# Patient Record
Sex: Female | Born: 1970 | Hispanic: No | Marital: Married | Smoking: Never smoker
Health system: Southern US, Community
[De-identification: ages and names within clinical notes are randomized; demographics above are authoritative.]

## PROBLEM LIST (undated history)

## (undated) DIAGNOSIS — J45909 Unspecified asthma, uncomplicated: Secondary | ICD-10-CM

## (undated) DIAGNOSIS — M654 Radial styloid tenosynovitis [de Quervain]: Secondary | ICD-10-CM

## (undated) DIAGNOSIS — I10 Essential (primary) hypertension: Secondary | ICD-10-CM

## (undated) DIAGNOSIS — R0789 Other chest pain: Secondary | ICD-10-CM

## (undated) DIAGNOSIS — M940 Chondrocostal junction syndrome [Tietze]: Secondary | ICD-10-CM

## (undated) HISTORY — DX: Other chest pain: R07.89

## (undated) HISTORY — PX: BREAST BIOPSY: SHX20

## (undated) HISTORY — DX: Chondrocostal junction syndrome (tietze): M94.0

## (undated) HISTORY — DX: Radial styloid tenosynovitis (de quervain): M65.4

## (undated) HISTORY — PX: APPENDECTOMY: SHX54

## (undated) HISTORY — PX: TOTAL LAPAROSCOPIC HYSTERECTOMY WITH SALPINGECTOMY: SHX6742

## (undated) HISTORY — PX: CHOLECYSTECTOMY: SHX55

## (undated) HISTORY — PX: ECTOPIC PREGNANCY SURGERY: SHX613

## (undated) HISTORY — PX: ABDOMINAL HYSTERECTOMY: SHX81

---

## 2004-12-21 ENCOUNTER — Emergency Department: Payer: Self-pay | Admitting: Emergency Medicine

## 2004-12-21 ENCOUNTER — Other Ambulatory Visit: Payer: Self-pay

## 2005-03-05 ENCOUNTER — Emergency Department: Payer: Self-pay | Admitting: Unknown Physician Specialty

## 2006-01-10 ENCOUNTER — Emergency Department: Payer: Self-pay | Admitting: Emergency Medicine

## 2006-04-06 ENCOUNTER — Emergency Department: Payer: Self-pay | Admitting: Emergency Medicine

## 2006-06-10 ENCOUNTER — Ambulatory Visit: Payer: Self-pay | Admitting: *Deleted

## 2006-09-12 ENCOUNTER — Ambulatory Visit: Payer: Self-pay | Admitting: *Deleted

## 2006-10-31 ENCOUNTER — Other Ambulatory Visit: Payer: Self-pay

## 2006-10-31 ENCOUNTER — Emergency Department: Payer: Self-pay | Admitting: Emergency Medicine

## 2007-07-12 ENCOUNTER — Emergency Department: Payer: Self-pay | Admitting: Emergency Medicine

## 2007-07-15 ENCOUNTER — Ambulatory Visit: Payer: Self-pay | Admitting: Family Medicine

## 2007-11-27 ENCOUNTER — Emergency Department (HOSPITAL_COMMUNITY): Admission: EM | Admit: 2007-11-27 | Discharge: 2007-11-27 | Payer: Self-pay | Admitting: Emergency Medicine

## 2007-12-18 ENCOUNTER — Ambulatory Visit: Payer: Self-pay | Admitting: Cardiovascular Disease

## 2008-01-06 ENCOUNTER — Encounter: Payer: Self-pay | Admitting: Cardiovascular Disease

## 2008-01-06 ENCOUNTER — Ambulatory Visit: Payer: Self-pay

## 2008-05-23 ENCOUNTER — Ambulatory Visit: Payer: Self-pay | Admitting: Family Medicine

## 2008-06-14 DIAGNOSIS — K8020 Calculus of gallbladder without cholecystitis without obstruction: Secondary | ICD-10-CM | POA: Insufficient documentation

## 2009-08-02 ENCOUNTER — Ambulatory Visit: Payer: Self-pay

## 2009-08-09 ENCOUNTER — Ambulatory Visit: Payer: Self-pay

## 2009-08-23 ENCOUNTER — Ambulatory Visit: Payer: Self-pay

## 2009-08-24 ENCOUNTER — Ambulatory Visit: Payer: Self-pay

## 2009-08-28 LAB — PATHOLOGY REPORT

## 2009-11-03 ENCOUNTER — Emergency Department: Payer: Self-pay | Admitting: Emergency Medicine

## 2010-05-29 NOTE — Assessment & Plan Note (Signed)
[facility]                            CARDIOLOGY OFFICE NOTE   NAME:ENOCHLayton, Tappan                         MRN:          [id number]  DATE:12/18/2007                            DOB:          1970-09-19    Ms. Linders is seen today at the request of [facility]  and [facility].   The patient was seen recently in the ER for chest pain.  She ruled out  for myocardial infarction, had a low-risk chest x-ray and EKG.  The  patient has been having symptoms for the last couple of months.  She was  initially diagnosed with costochondritis.  She has had this in the past.  She states nearly 90s.  Her pain is very atypical.  It is sharp.  It is  in the center of her chest.  It is related to increased problems with  breathing and asthma.  She has had a recent diagnosis over the last 2  years.  She is a nonsmoker.  However, during her ER visit, she had to  have inhalers.  She subsequently had improvement in her chest pain  syndrome with a Prevpak.   The pain in her chest was clearly related to a bronchitis and asthmatic  event as well.   She seems to think that her symptoms are persistent, but they are  improved.   There is no obvious pleuritic component.  She has not had previous DVT.   There is no previous history of connective tissue disease.   The patient's review of systems are remarkable for significant headache  and photophobia.   Her past medical history is remarkable for hypertension.  There is also  a history of syphilis, apparently this was in her younger days.  It was  suboptimally treated and her titers have risen recently.  I do not know  any other details about this.  She did have an HIV test in August, which  was negative.  She has a history of headaches.   Also, history of appendectomy.  Two previous appendectomies.   ALLERGIES:  The patient is allergic to ERYTHROMYCIN and GUANFACINE.   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  She is married.  She has 1 child, who will be 65  years old soon.  She enjoys playing on the computer and is fairly  sedentary.  The patient does not smoke.  She does not drink on a regular  basis.   FAMILY HISTORY:  Remarkable for mother being alive at age 41.  Father  died of unknown causes at age 41.   CURRENT MEDICATIONS:  1. Flovent 2 puffs b.i.d.  2. Albuterol p.r.n.  3. Hydrochlorothiazide.  4. Lisinopril, dose unknown.   PHYSICAL EXAMINATION:  GENERAL:  Remarkable for an overweight  photophobic black female.  There is hint of a slight malar rash.  VITAL SIGNS:  Her blood pressure is 130/80; weight is 266; pulse 88 and  regular; and respiratory rate 14, afebrile.  HEENT:  Unremarkable.  NECK:  No cervical adenopathy.  No lymphadenopathy, thyromegaly, or JVP  elevation.  LUNGS:  Clear, good diaphragmatic motion.  No wheezing.  S1 and S2.  Normal heart sounds.  PMI normal.  ABDOMEN:  Benign.  Bowel sounds positive.  No AAA.  No tenderness.  No  bruit.  No hepatosplenomegaly.  No hepatojugular reflux.  EXTREMITIES:  Distal pulses are intact.  No edema.  NEURO:  Nonfocal.  SKIN:  Warm and dry.  MUSCULOSKELETAL:  No muscular weakness.   Her baseline EKG is normal.   IMPRESSION:  1. 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  Continue nonsteroidal      antiinflammatories.  At this point, I do not think a stress test is      indicated.  However, we will do a 2-D echocardiogram to assess      structural integrity of her heart and rule out pericarditis.  2. In terms of her chest pain syndrome, she does have a slight malar      rash.  I think we should try some basic lab work to rule out any      inflammatory connective tissue disease.  This will include a sed      rate, a rheumatoid factor, and ANA.  3. Hypertension, currently well controlled.  Continue current dose of      lisinopril/hydrochlorothiazide.  4.  Asthma, currently appears to be well controlled with improvement on      prednisone.  Continue current dose of inhalers.  5. Severe headache in the setting of rising syphilis titers.  We will      check a head CT on the patient today.  Since she has a bad headache      and photophobia, I think this would be important to rule out any      central nervous system lesions.   Further recommendations based on the results of her head CT, lab work,  and echo.     Noralyn Pick. Eden Emms, MD, [facility]  Electronically Signed    PCN/MedQ  DD: 12/18/2007  DT: 12/19/2007  Job #: [id number]

## 2010-07-19 ENCOUNTER — Emergency Department: Payer: Self-pay | Admitting: Emergency Medicine

## 2010-07-23 ENCOUNTER — Emergency Department: Payer: Self-pay | Admitting: Emergency Medicine

## 2010-09-12 IMAGING — CR DG CHEST 1V PORT
1 series · 1 of 1 positions shown · non-contrast
Comparison: None

CLINICAL DATA: Chest pain.

PORTABLE CHEST - 1 VIEW

[AP]
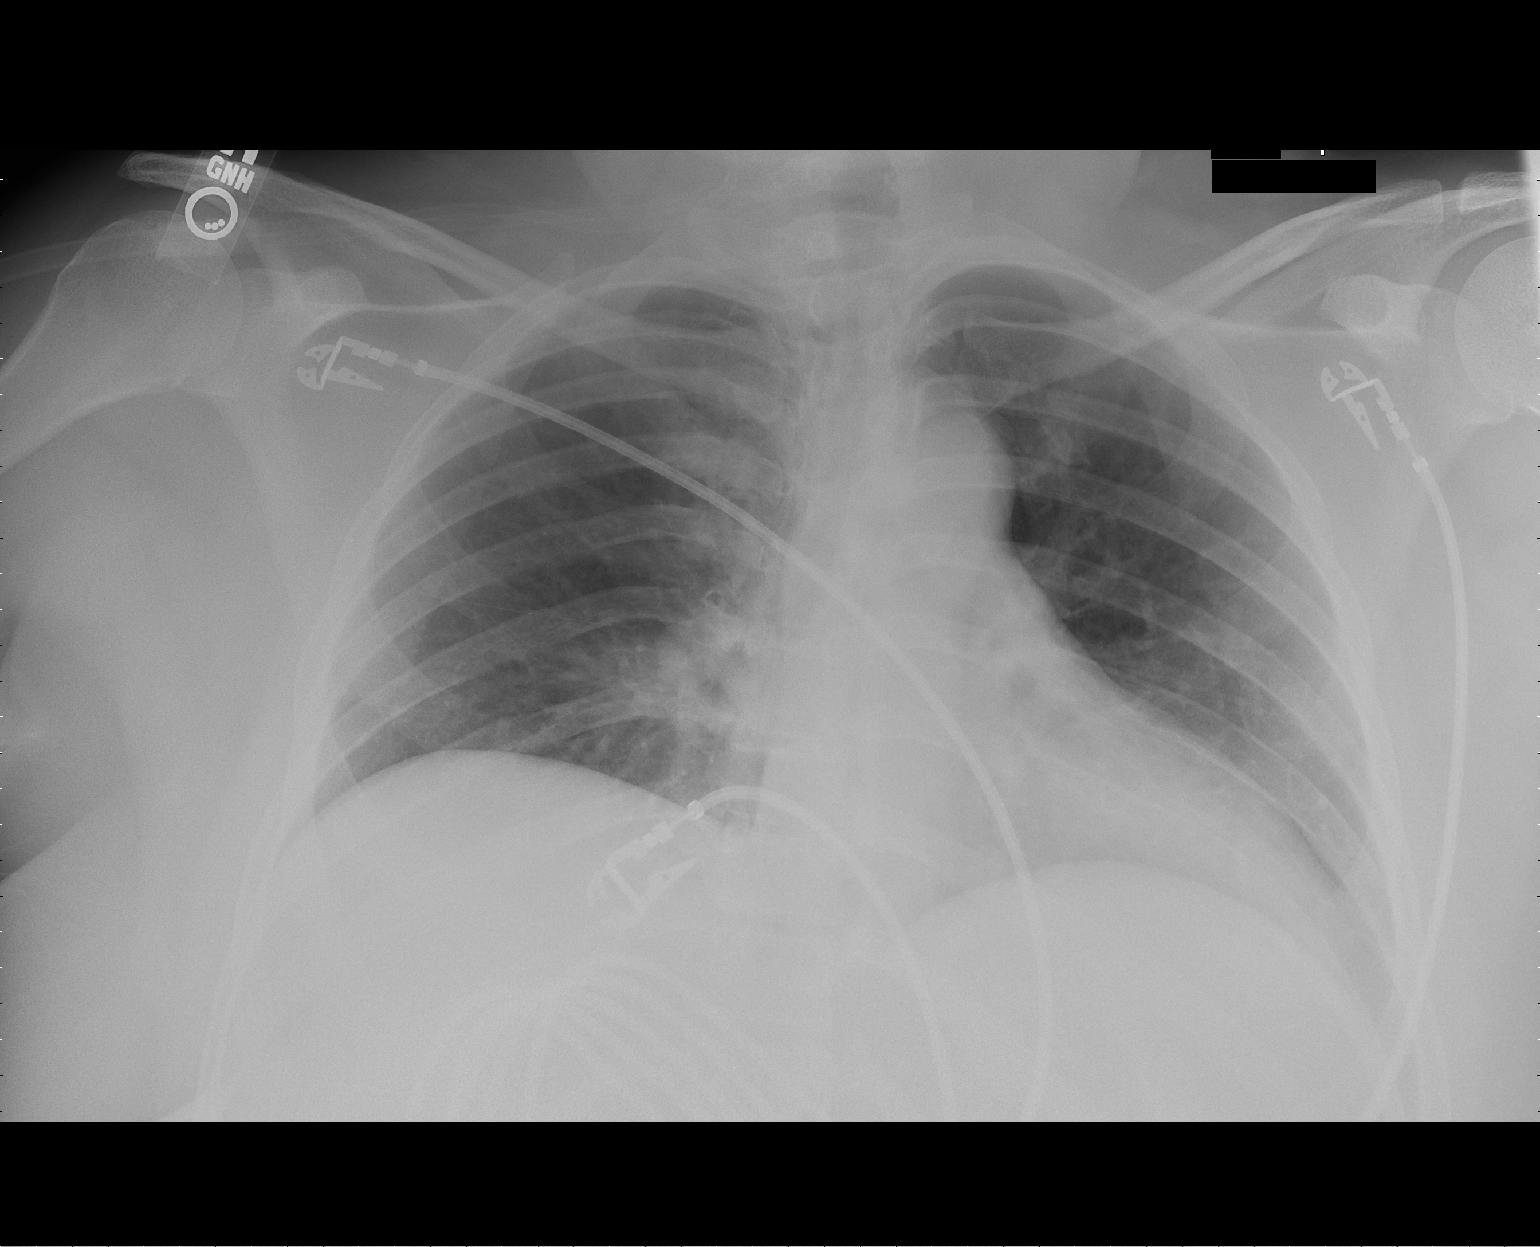

[1 of 1 positions shown; findings below may reference images not displayed]

FINDINGS: Decreased lung volume with mild atelectasis in the lung
bases.  There is no heart failure or effusion.  The heart is mildly
enlarged.
IMPRESSION: Hypoventilation with bibasilar atelectasis.

## 2010-10-16 LAB — POCT CARDIAC MARKERS
CKMB, poc: <1 — ABNORMAL LOW
CKMB, poc: <1 — ABNORMAL LOW
CKMB, poc: <1 — ABNORMAL LOW
Myoglobin, poc: 72.8
Troponin i, poc: <0.05
Troponin i, poc: <0.05

## 2010-10-16 LAB — POCT I-STAT, CHEM 8
Calcium, Ion: 1.14
Creatinine, Ser: 1.2
Potassium: 3.6
Sodium: 141

## 2010-10-16 LAB — D-DIMER, QUANTITATIVE: D-Dimer, Quant: 0.25

## 2010-10-26 ENCOUNTER — Other Ambulatory Visit (HOSPITAL_COMMUNITY): Payer: Self-pay | Admitting: Obstetrics and Gynecology

## 2010-10-26 DIAGNOSIS — N979 Female infertility, unspecified: Secondary | ICD-10-CM

## 2010-11-01 ENCOUNTER — Ambulatory Visit (HOSPITAL_COMMUNITY)
Admission: RE | Admit: 2010-11-01 | Discharge: 2010-11-01 | Disposition: A | Discharge status: Home / Self Care | Payer: BC Managed Care – PPO | Source: Ambulatory Visit | Attending: Obstetrics and Gynecology | Admitting: Obstetrics and Gynecology

## 2010-11-01 DIAGNOSIS — N979 Female infertility, unspecified: Secondary | ICD-10-CM | POA: Insufficient documentation

## 2010-11-01 MED ORDER — IOHEXOL 300 MG/ML  SOLN: 12.0000 mL | Freq: Once | INTRAMUSCULAR | Status: AC | PRN

## 2011-06-05 ENCOUNTER — Encounter (HOSPITAL_COMMUNITY): Payer: Self-pay | Admitting: *Deleted

## 2011-06-05 ENCOUNTER — Emergency Department (HOSPITAL_COMMUNITY): Payer: BC Managed Care – PPO

## 2011-06-05 ENCOUNTER — Emergency Department (HOSPITAL_COMMUNITY)
Admission: EM | Admit: 2011-06-05 | Discharge: 2011-06-05 | Disposition: A | Discharge status: Home / Self Care | Payer: BC Managed Care – PPO | Attending: Emergency Medicine | Admitting: Emergency Medicine

## 2011-06-05 DIAGNOSIS — Z79899 Other long term (current) drug therapy: Secondary | ICD-10-CM | POA: Insufficient documentation

## 2011-06-05 DIAGNOSIS — G43909 Migraine, unspecified, not intractable, without status migrainosus: Secondary | ICD-10-CM | POA: Insufficient documentation

## 2011-06-05 MED ORDER — DEXAMETHASONE SODIUM PHOSPHATE 10 MG/ML IJ SOLN
10.0000 mg | Freq: Once | INTRAMUSCULAR | Status: AC
  Administered 2011-06-05: 10 mg via INTRAVENOUS
  Filled 2011-06-05: qty 1

## 2011-06-05 MED ORDER — METOCLOPRAMIDE HCL 5 MG/ML IJ SOLN
10.0000 mg | Freq: Once | INTRAMUSCULAR | Status: AC
  Administered 2011-06-05: 10 mg via INTRAVENOUS
  Filled 2011-06-05: qty 2

## 2011-06-05 MED ORDER — DIPHENHYDRAMINE HCL 50 MG/ML IJ SOLN
25.0000 mg | Freq: Once | INTRAMUSCULAR | Status: AC
  Administered 2011-06-05: 25 mg via INTRAVENOUS
  Filled 2011-06-05: qty 1

## 2011-06-05 MED ORDER — ONDANSETRON HCL 4 MG PO TABS: 4.0000 mg | ORAL_TABLET | Freq: Four times a day (QID) | ORAL | Status: AC

## 2011-06-05 NOTE — ED Notes (Signed)
Patient back from CT.

## 2011-06-05 NOTE — ED Provider Notes (Signed)
History     CSN: [id number]  Arrival date & time 06/05/11  1412   None     Chief Complaint  Patient presents with  . Migraine  . Tingling    (Consider location/radiation/quality/duration/timing/severity/associated sxs/prior treatment) Patient is a 41 y.o. female presenting with migraine. The history is provided by the patient. No language interpreter was used.  Migraine This is a new problem. The current episode started in the past 7 days. The problem occurs constantly. The problem has been gradually worsening. Associated symptoms include headaches and nausea. Pertinent negatives include no chills, fever, neck pain, numbness, vertigo, vomiting or weakness. Exacerbated by: light. She has tried NSAIDs for the symptoms.  [age]-year-old female with complaint of migraine headache that started gradually on Monday night. States that she woke Tuesday with a severe headache and has taking 1400 mg of ibuprofen yesterday. Today she went to work at the [employer] office and they gave her 800 mg of ibuprofen with no relief. Has  nausea but no vomiting photophobia and facial tingling.  The headache started out primarily bilaterally it felt like a vice to her for head. Now the pain is primarily on the right on the right eye. Neuro intact. Last vision exam last month no changes. A history of migraines. Last migraine headache was in 2003. CT is Dr. Ranae Pila in [facility] Clinical history of hypertension migraine costochondritis the patient is on metformin for fertility issues.  Past Medical History  Diagnosis Date  . Migraine     Past Surgical History  Procedure Date  . Cholecystectomy   . Appendectomy   . Ectopic pregnancy surgery     No family history on file.  History  Substance Use Topics  . Smoking status: Never Smoker   . Smokeless tobacco: Not on file  . Alcohol Use: Yes    OB History    Grav Para Term Preterm Abortions TAB SAB Ect Mult Living                  Review of  Systems  Constitutional: Negative.  Negative for fever and chills.  HENT: Positive for sneezing. Negative for hearing loss, ear pain, neck pain, dental problem, sinus pressure and ear discharge.   Eyes: Negative.   Respiratory: Negative.   Cardiovascular: Negative.   Gastrointestinal: Positive for nausea. Negative for vomiting.  Neurological: Positive for headaches. Negative for dizziness, vertigo, facial asymmetry, speech difficulty, weakness and numbness.  Psychiatric/Behavioral: Negative.   All other systems reviewed and are negative.    Allergies  Erythromycin and Guaifenesin & derivatives  Home Medications   Current Outpatient Rx  Name Route Sig Dispense Refill  . ALBUTEROL SULFATE HFA 108 (90 BASE) MCG/ACT IN AERS Inhalation Inhale 2 puffs into the lungs every 6 (six) hours as needed. For SOB    . AMLODIPINE BESYLATE 10 MG PO TABS Oral Take 10 mg by mouth daily.    Marland Kitchen CITALOPRAM HYDROBROMIDE 10 MG PO TABS Oral Take 10 mg by mouth at bedtime.    Marland Kitchen FLUTICASONE PROPIONATE  HFA 220 MCG/ACT IN AERO Inhalation Inhale 2 puffs into the lungs 2 (two) times daily.    Marland Kitchen HYDROCHLOROTHIAZIDE 25 MG PO TABS Oral Take 25 mg by mouth daily.    Marland Kitchen LABETALOL HCL 100 MG PO TABS Oral Take 100 mg by mouth 2 (two) times daily.    Marland Kitchen METFORMIN HCL ER 500 MG PO TB24 Oral Take 500 mg by mouth daily with breakfast.  BP 144/85  Pulse 91  Temp(Src) 98.2 F (36.8 C) (Oral)  Resp 18  Ht 5' 7.5" (1.715 m)  Wt 250 lb 2 oz (113.456 kg)  BMI 38.60 kg/m2  SpO2 98%  Physical Exam  Nursing note and vitals reviewed. Constitutional: She is oriented to person, place, and time. She appears well-developed and well-nourished.  HENT:  Head: Normocephalic and atraumatic.  Eyes: Conjunctivae and EOM are normal. Pupils are equal, round, and reactive to light.  Neck: Normal range of motion. Neck supple.  Cardiovascular: Normal rate.   Pulmonary/Chest: Effort normal and breath sounds normal. No respiratory  distress.  Abdominal: Soft. Bowel sounds are normal. She exhibits no distension. There is no tenderness.  Musculoskeletal: Normal range of motion. She exhibits no edema and no tenderness.  Neurological: She is alert and oriented to person, place, and time. She has normal strength and normal reflexes. No cranial nerve deficit or sensory deficit. She displays a negative Romberg sign. GCS eye subscore is 4. GCS verbal subscore is 5. GCS motor subscore is 6.  Skin: Skin is warm and dry.  Psychiatric: She has a normal mood and affect.    ED Course  Procedures (including critical care time)  Labs Reviewed - No data to display No results found.   No diagnosis found.    MDM  Migraine h/a with relief in the ER after migraine cocktail.  Neuro in tact.  Will follow up with pcp in  this week.  Return if worse.          Remi Haggard, NP 06/06/11 1242

## 2011-06-05 NOTE — ED Notes (Addendum)
States pain started on Tuesday and has gotten worsen.  C/O light irritation today.  C/O nausea only, denies vomiting.

## 2011-06-05 NOTE — ED Notes (Signed)
Patient transported to CT 

## 2011-06-05 NOTE — Discharge Instructions (Signed)
Ms Boyan we gave you a migraine cocktail in the ER today with Benadryl, decadron and reglan.  Followup with your PCP so you can come up with a plan to treat the migraine headaches if they reoccur. Zofran is for nausea if you have more nausea when you go home. Return to the ER for severe pain, weakness on one side the other, or uncontrolled nausea and vomiting.  Recurrent Migraine Headache You have a recurrent migraine headache. The caregiver can usually provide good relief for this headache. If this headache is the same as your previous migraine headaches, it is safe to treat you without repeating a complete evaluation.  These headaches usually have at least two of the following problems:   They occur on one side of the head, pulsate, and are severe enough to prevent daily activities.   They are aggravated by daily physical activities.  You may have one or more of the following symptoms:   Nausea (feeling sick to your stomach).   Vomiting.   Pain with exposure to bright lights or loud noises.  Most headache sufferers have a family history of migraines. Your headaches may also be related to alcohol and smoking habits. Too much sleep, too little sleep, mood, and anxiety may also play a part. Changing some of these triggers may help you lower the number and level of pain of the headaches. Headaches may be related to menses (female menstruation). There are numerous medications that can prevent these headaches. Your caregiver can help you with a medication or regimen (procedure to follow). If this has been a chronic (long-term) condition, the use of long-term narcotics is not recommended. Using long-term narcotics can cause recurrent migraines. Narcotics are only a temporary measure only. They are used for the infrequent migraine that fails to respond to all other measures. SEEK MEDICAL CARE IF:   You do not get relief from the medications given to you.   You have a recurrence of pain.   This  headache begins to differ from past migraine (for example if it is more severe).  SEEK IMMEDIATE MEDICAL CARE IF:  You have a fever.   You have a stiff neck.   You have vision loss or have changes in vision.   You have problems with feeling lightheaded, become faint, or lose your balance.   You have muscular weakness.   You have loss of muscular control.   You develop severe symptoms different from your first symptoms.   You start losing your balance or have trouble walking.   You feel faint or pass out.  MAKE SURE YOU:   Understand these instructions.   Will watch your condition.   Will get help right away if you are not doing well or get worse.  Document Released: 09/25/2000 Document Revised: 12/20/2010 Document Reviewed: 08/20/2007 [facility] Patient Information 2012 [place], [place].Migraine Headache A migraine is very bad pain on one or both sides of your head. The cause of a migraine is not always known. A migraine can be triggered or caused by different things, such as:  Alcohol.   Smoking.   Stress.   Periods (menstruation) in women.   Aged cheeses.   Foods or drinks that contain nitrates, glutamate, aspartame, or tyramine.   Lack of sleep.   Chocolate.   Caffeine.   Hunger.   Medicines, such as nitroglycerine (used to treat chest pain), birth control pills, estrogen, and some blood pressure medicines.  HOME CARE  Many medicines can help migraine pain or  keep migraines from coming back. Your doctor can help you decide on a medicine or treatment program.   If you or your child gets a migraine, it may help to lie down in a dark, quiet room.   Keep a headache journal. This may help find out what is causing the headaches. For example, write down:   What you eat and drink.   How much sleep you get.   Any change to your diet or medicines.  GET HELP RIGHT AWAY IF:   The medicine does not work.   The pain begins again.   The neck is stiff.   You  have trouble seeing.   The muscles are weak or you lose muscle control.   You have new symptoms.   You lose your balance.   You have trouble walking.   You feel faint or pass out.  MAKE SURE YOU:   Understand these instructions.   Will watch this condition.   Will get help right away if you are not doing well or get worse.  Document Released: 10/10/2007 Document Revised: 12/20/2010 Document Reviewed: 09/05/2008 [facility] Patient Information 2012 [place], [place].

## 2011-06-05 NOTE — ED Notes (Signed)
Patient reports she has had a migraine headache for 1.5 days with tingling all over her face.  This is a new sx for her.  Her md advised that she come to ED for further eval.

## 2011-06-07 NOTE — ED Provider Notes (Signed)
Medical screening examination/treatment/procedure(s) were performed by non-physician practitioner and as supervising physician I was immediately available for consultation/collaboration.   Ilda Laskin, MD 06/07/11 0407 

## 2011-11-27 ENCOUNTER — Ambulatory Visit: Payer: Self-pay | Admitting: Family Medicine

## 2011-12-11 ENCOUNTER — Ambulatory Visit: Payer: Self-pay | Admitting: Family Medicine

## 2012-01-01 ENCOUNTER — Ambulatory Visit: Payer: Self-pay | Admitting: Surgery

## 2012-07-02 ENCOUNTER — Ambulatory Visit: Payer: Self-pay | Admitting: Surgery

## 2012-07-06 ENCOUNTER — Emergency Department: Payer: Self-pay | Admitting: Emergency Medicine

## 2012-07-06 LAB — BASIC METABOLIC PANEL
Anion Gap: 6 — ABNORMAL LOW (ref 7–16)
Chloride: 107 mmol/L (ref 98–107)
Co2: 27 mmol/L (ref 21–32)
Creatinine: 1.06 mg/dL (ref 0.60–1.30)
EGFR (African American): >60
Osmolality: 277 (ref 275–301)
Potassium: 3.7 mmol/L (ref 3.5–5.1)
Sodium: 140 mmol/L (ref 136–145)

## 2012-07-06 LAB — CBC
MCH: 23.9 pg — ABNORMAL LOW (ref 26.0–34.0)
WBC: 5 10*3/uL (ref 3.6–11.0)

## 2012-07-06 LAB — PROTIME-INR
INR: 1.1
Prothrombin Time: 14.1 secs (ref 11.5–14.7)

## 2012-07-06 LAB — TROPONIN I: Troponin-I: <0.02 ng/mL

## 2012-07-24 DIAGNOSIS — D509 Iron deficiency anemia, unspecified: Secondary | ICD-10-CM | POA: Insufficient documentation

## 2012-07-27 ENCOUNTER — Emergency Department: Payer: Self-pay | Admitting: Emergency Medicine

## 2012-07-27 LAB — COMPREHENSIVE METABOLIC PANEL
Albumin: 3.5 g/dL (ref 3.4–5.0)
Alkaline Phosphatase: 176 U/L — ABNORMAL HIGH (ref 50–136)
Anion Gap: 3 — ABNORMAL LOW (ref 7–16)
Chloride: 104 mmol/L (ref 98–107)
Co2: 30 mmol/L (ref 21–32)
Creatinine: 1.07 mg/dL (ref 0.60–1.30)
EGFR (Non-African Amer.): >60
Glucose: 91 mg/dL (ref 65–99)
SGOT(AST): 179 U/L — ABNORMAL HIGH (ref 15–37)
SGPT (ALT): 229 U/L — ABNORMAL HIGH (ref 12–78)
Sodium: 137 mmol/L (ref 136–145)
Total Protein: 7.6 g/dL (ref 6.4–8.2)

## 2012-07-27 LAB — LIPASE, BLOOD: Lipase: 139 U/L (ref 73–393)

## 2012-07-27 LAB — CBC
HCT: 39.5 % (ref 35.0–47.0)
HGB: 12.7 g/dL (ref 12.0–16.0)
MCV: 78 fL — ABNORMAL LOW (ref 80–100)
RBC: 5.07 10*6/uL (ref 3.80–5.20)
RDW: 19.2 % — ABNORMAL HIGH (ref 11.5–14.5)
WBC: 7 10*3/uL (ref 3.6–11.0)

## 2012-07-27 LAB — URINALYSIS, COMPLETE
Bilirubin,UR: NEGATIVE
Glucose,UR: NEGATIVE mg/dL (ref 0–75)
Ph: 6 (ref 4.5–8.0)
RBC,UR: 3 /HPF (ref 0–5)
Squamous Epithelial: 4
WBC UR: 13 /HPF (ref 0–5)

## 2012-07-27 LAB — CLOSTRIDIUM DIFFICILE BY PCR

## 2012-07-30 LAB — URINE CULTURE

## 2012-08-26 LAB — STOOL CULTURE

## 2012-12-23 ENCOUNTER — Ambulatory Visit: Payer: Self-pay | Admitting: Surgery

## 2013-03-04 ENCOUNTER — Emergency Department: Payer: Self-pay | Admitting: Emergency Medicine

## 2013-03-04 LAB — URINALYSIS, COMPLETE
BILIRUBIN, UR: NEGATIVE
GLUCOSE, UR: NEGATIVE mg/dL (ref 0–75)
KETONE: NEGATIVE
LEUKOCYTE ESTERASE: NEGATIVE
NITRITE: NEGATIVE
PH: 6 (ref 4.5–8.0)
Protein: NEGATIVE
SPECIFIC GRAVITY: 1.01 (ref 1.003–1.030)
WBC UR: 1 /HPF (ref 0–5)

## 2013-03-04 LAB — COMPREHENSIVE METABOLIC PANEL
ALK PHOS: 75 U/L
ALT: 25 U/L (ref 12–78)
Albumin: 3.6 g/dL (ref 3.4–5.0)
Anion Gap: 4 — ABNORMAL LOW (ref 7–16)
BILIRUBIN TOTAL: 0.3 mg/dL (ref 0.2–1.0)
BUN: 10 mg/dL (ref 7–18)
CALCIUM: 7.9 mg/dL — AB (ref 8.5–10.1)
CHLORIDE: 103 mmol/L (ref 98–107)
CO2: 30 mmol/L (ref 21–32)
CREATININE: 1.08 mg/dL (ref 0.60–1.30)
Glucose: 99 mg/dL (ref 65–99)
Osmolality: 273 (ref 275–301)
POTASSIUM: 3.3 mmol/L — AB (ref 3.5–5.1)
SGOT(AST): 23 U/L (ref 15–37)
Sodium: 137 mmol/L (ref 136–145)
TOTAL PROTEIN: 7.8 g/dL (ref 6.4–8.2)

## 2013-03-04 LAB — LIPASE, BLOOD: Lipase: 236 U/L (ref 73–393)

## 2013-03-04 LAB — PREGNANCY, URINE: Pregnancy Test, Urine: NEGATIVE m[IU]/mL

## 2013-03-05 LAB — CBC WITH DIFFERENTIAL/PLATELET
BASOS PCT: 1.5 %
Basophil #: 0.1 10*3/uL (ref 0.0–0.1)
Eosinophil #: 0.3 10*3/uL (ref 0.0–0.7)
Eosinophil %: 4.3 %
HCT: 33 % — AB (ref 35.0–47.0)
HGB: 10.2 g/dL — AB (ref 12.0–16.0)
LYMPHS ABS: 1.6 10*3/uL (ref 1.0–3.6)
Lymphocyte %: 24.8 %
MCH: 23.1 pg — ABNORMAL LOW (ref 26.0–34.0)
MCHC: 30.8 g/dL — AB (ref 32.0–36.0)
MCV: 75 fL — ABNORMAL LOW (ref 80–100)
MONO ABS: 0.7 x10 3/mm (ref 0.2–0.9)
MONOS PCT: 11.3 %
NEUTROS ABS: 3.7 10*3/uL (ref 1.4–6.5)
Neutrophil %: 58.1 %
Platelet: 287 10*3/uL (ref 150–440)
RBC: 4.41 10*6/uL (ref 3.80–5.20)
RDW: 16.7 % — AB (ref 11.5–14.5)
WBC: 6.4 10*3/uL (ref 3.6–11.0)

## 2013-08-11 DIAGNOSIS — D219 Benign neoplasm of connective and other soft tissue, unspecified: Secondary | ICD-10-CM | POA: Insufficient documentation

## 2013-08-25 DIAGNOSIS — J45909 Unspecified asthma, uncomplicated: Secondary | ICD-10-CM | POA: Insufficient documentation

## 2013-08-25 DIAGNOSIS — I10 Essential (primary) hypertension: Secondary | ICD-10-CM | POA: Insufficient documentation

## 2013-08-27 ENCOUNTER — Other Ambulatory Visit (HOSPITAL_COMMUNITY): Payer: Self-pay | Admitting: Obstetrics and Gynecology

## 2013-08-27 DIAGNOSIS — Z01818 Encounter for other preprocedural examination: Secondary | ICD-10-CM

## 2013-08-31 ENCOUNTER — Ambulatory Visit (HOSPITAL_COMMUNITY)
Admission: RE | Admit: 2013-08-31 | Discharge: 2013-08-31 | Disposition: A | Discharge status: Home / Self Care | Payer: BC Managed Care – PPO | Source: Ambulatory Visit | Attending: Obstetrics and Gynecology | Admitting: Obstetrics and Gynecology

## 2013-08-31 DIAGNOSIS — Z01818 Encounter for other preprocedural examination: Secondary | ICD-10-CM | POA: Insufficient documentation

## 2013-12-28 ENCOUNTER — Ambulatory Visit: Payer: Self-pay | Admitting: Family Medicine

## 2015-05-05 ENCOUNTER — Encounter: Payer: Self-pay | Admitting: Emergency Medicine

## 2015-05-05 ENCOUNTER — Emergency Department
Admission: EM | Admit: 2015-05-05 | Discharge: 2015-05-05 | Disposition: A | Discharge status: Home / Self Care | Payer: BLUE CROSS/BLUE SHIELD | Attending: Emergency Medicine | Admitting: Emergency Medicine

## 2015-05-05 ENCOUNTER — Emergency Department: Payer: BLUE CROSS/BLUE SHIELD

## 2015-05-05 DIAGNOSIS — N390 Urinary tract infection, site not specified: Secondary | ICD-10-CM | POA: Diagnosis not present

## 2015-05-05 DIAGNOSIS — I10 Essential (primary) hypertension: Secondary | ICD-10-CM | POA: Insufficient documentation

## 2015-05-05 DIAGNOSIS — J45909 Unspecified asthma, uncomplicated: Secondary | ICD-10-CM | POA: Diagnosis not present

## 2015-05-05 DIAGNOSIS — R5381 Other malaise: Secondary | ICD-10-CM

## 2015-05-05 DIAGNOSIS — R51 Headache: Secondary | ICD-10-CM | POA: Insufficient documentation

## 2015-05-05 DIAGNOSIS — Z79899 Other long term (current) drug therapy: Secondary | ICD-10-CM | POA: Diagnosis not present

## 2015-05-05 DIAGNOSIS — Z7984 Long term (current) use of oral hypoglycemic drugs: Secondary | ICD-10-CM | POA: Insufficient documentation

## 2015-05-05 HISTORY — DX: Essential (primary) hypertension: I10

## 2015-05-05 HISTORY — DX: Unspecified asthma, uncomplicated: J45.909

## 2015-05-05 LAB — URINALYSIS COMPLETE WITH MICROSCOPIC (ARMC ONLY)
Bilirubin Urine: NEGATIVE
Glucose, UA: NEGATIVE mg/dL
Ketones, ur: NEGATIVE mg/dL
Leukocytes, UA: NEGATIVE
NITRITE: POSITIVE — AB
PROTEIN: NEGATIVE mg/dL
SPECIFIC GRAVITY, URINE: 1.009 (ref 1.005–1.030)
pH: 7 (ref 5.0–8.0)

## 2015-05-05 LAB — BASIC METABOLIC PANEL
ANION GAP: 5 (ref 5–15)
BUN: 8 mg/dL (ref 6–20)
CO2: 26 mmol/L (ref 22–32)
Calcium: 8.8 mg/dL — ABNORMAL LOW (ref 8.9–10.3)
Chloride: 107 mmol/L (ref 101–111)
Creatinine, Ser: 0.89 mg/dL (ref 0.44–1.00)
GFR calc Af Amer: >60 mL/min (ref >60)
GFR calc non Af Amer: >60 mL/min (ref >60)
GLUCOSE: 94 mg/dL (ref 65–99)
Potassium: 3.5 mmol/L (ref 3.5–5.1)
Sodium: 138 mmol/L (ref 135–145)

## 2015-05-05 LAB — CBC
HCT: 41.7 % (ref 35.0–47.0)
HEMOGLOBIN: 14.1 g/dL (ref 12.0–16.0)
MCH: 29.3 pg (ref 26.0–34.0)
MCHC: 33.7 g/dL (ref 32.0–36.0)
MCV: 87 fL (ref 80.0–100.0)
Platelets: 185 10*3/uL (ref 150–440)
RBC: 4.8 MIL/uL (ref 3.80–5.20)
RDW: 14.2 % (ref 11.5–14.5)
WBC: 6.3 10*3/uL (ref 3.6–11.0)

## 2015-05-05 MED ORDER — CIPROFLOXACIN HCL 500 MG PO TABS: 500.0000 mg | ORAL_TABLET | Freq: Two times a day (BID) | ORAL | Status: DC

## 2015-05-05 NOTE — Discharge Instructions (Signed)
Urinary Tract Infection °Urinary tract infections (UTIs) can develop anywhere along your urinary tract. Your urinary tract is your body's drainage system for removing wastes and extra water. Your urinary tract includes two kidneys, two ureters, a bladder, and a urethra. Your kidneys are a pair of bean-shaped organs. Each kidney is about the size of your fist. They are located below your ribs, one on each side of your spine. °CAUSES °Infections are caused by microbes, which are microscopic organisms, including fungi, viruses, and bacteria. These organisms are so small that they can only be seen through a microscope. Bacteria are the microbes that most commonly cause UTIs. °SYMPTOMS  °Symptoms of UTIs may vary by age and gender of the patient and by the location of the infection. Symptoms in young women typically include a frequent and intense urge to urinate and a painful, burning feeling in the bladder or urethra during urination. Older women and men are more likely to be tired, shaky, and weak and have muscle aches and abdominal pain. A fever may mean the infection is in your kidneys. Other symptoms of a kidney infection include pain in your back or sides below the ribs, nausea, and vomiting. °DIAGNOSIS °To diagnose a UTI, your caregiver will ask you about your symptoms. Your caregiver will also ask you to provide a urine sample. The urine sample will be tested for bacteria and white blood cells. White blood cells are made by your body to help fight infection. °TREATMENT  °Typically, UTIs can be treated with medication. Because most UTIs are caused by a bacterial infection, they usually can be treated with the use of antibiotics. The choice of antibiotic and length of treatment depend on your symptoms and the type of bacteria causing your infection. °HOME CARE INSTRUCTIONS °· If you were prescribed antibiotics, take them exactly as your caregiver instructs you. Finish the medication even if you feel better after  you have only taken some of the medication. °· Drink enough water and fluids to keep your urine clear or pale yellow. °· Avoid caffeine, tea, and carbonated beverages. They tend to irritate your bladder. °· Empty your bladder often. Avoid holding urine for long periods of time. °· Empty your bladder before and after sexual intercourse. °· After a bowel movement, women should cleanse from front to back. Use each tissue only once. °SEEK MEDICAL CARE IF:  °· You have back pain. °· You develop a fever. °· Your symptoms do not begin to resolve within 3 days. °SEEK IMMEDIATE MEDICAL CARE IF:  °· You have severe back pain or lower abdominal pain. °· You develop chills. °· You have nausea or vomiting. °· You have continued burning or discomfort with urination. °MAKE SURE YOU:  °· Understand these instructions. °· Will watch your condition. °· Will get help right away if you are not doing well or get worse. °  °This information is not intended to replace advice given to you by your health care provider. Make sure you discuss any questions you have with your health care provider. °  °Document Released: 10/10/2004 Document Revised: 09/21/2014 Document Reviewed: 02/08/2011 °Elsevier Interactive Patient Education ©2016 [employer] ° °Weakness °Weakness is a lack of strength. It may be felt all over the body (generalized) or in one specific part of the body (focal). Some causes of weakness can be serious. You may need further medical evaluation, especially if you are elderly or you have a history of immunosuppression (such as chemotherapy or HIV), kidney disease, heart disease, or   diabetes. °CAUSES  °Weakness can be caused by many different things, including: °· Infection. °· Physical exhaustion. °· Internal bleeding or other blood loss that results in a lack of red blood cells (anemia). °· Dehydration. This cause is more common in elderly people. °· Side effects or electrolyte abnormalities from medicines, such as pain  medicines or sedatives. °· Emotional distress, anxiety, or depression. °· Circulation problems, especially severe peripheral arterial disease. °· Heart disease, such as rapid atrial fibrillation, bradycardia, or heart failure. °· Nervous system disorders, such as Guillain-Barré syndrome, multiple sclerosis, or stroke. °DIAGNOSIS  °To find the cause of your weakness, your caregiver will take your history and perform a physical exam. Lab tests or X-rays may also be ordered, if needed. °TREATMENT  °Treatment of weakness depends on the cause of your symptoms and can vary greatly. °HOME CARE INSTRUCTIONS  °· Rest as needed. °· Eat a well-balanced diet. °· Try to get some exercise every day. °· Only take over-the-counter or prescription medicines as directed by your caregiver. °SEEK MEDICAL CARE IF:  °· Your weakness seems to be getting worse or spreads to other parts of your body. °· You develop new aches or pains. °SEEK IMMEDIATE MEDICAL CARE IF:  °· You cannot perform your normal daily activities, such as getting dressed and feeding yourself. °· You cannot walk up and down stairs, or you feel exhausted when you do so. °· You have shortness of breath or chest pain. °· You have difficulty moving parts of your body. °· You have weakness in only one area of the body or on only one side of the body. °· You have a fever. °· You have trouble speaking or swallowing. °· You cannot control your bladder or bowel movements. °· You have black or bloody vomit or stools. °MAKE SURE YOU: °· Understand these instructions. °· Will watch your condition. °· Will get help right away if you are not doing well or get worse. °  °This information is not intended to replace advice given to you by your health care provider. Make sure you discuss any questions you have with your health care provider. °  °Document Released: 12/31/2004 Document Revised: 07/02/2011 Document Reviewed: 03/01/2011 °Elsevier Interactive Patient Education ©2016 [employer]  Inc. ° °

## 2015-05-05 NOTE — ED Provider Notes (Signed)
[facility] Emergency Department Provider Note  ____________________________________________  Time seen: Approximately 4:20 PM  I have reviewed the triage vital signs and the nursing notes.   HISTORY  Chief Complaint Hypertension    HPI Marcia Maldonado is a 45 y.o. female presents emergency department complaining of general malaise, headache, increased blood pressure, facial numbness, mild chest discomfort, and suprapubic discomfort. Patient states the symptoms have been mild and intermittent in nature 2-3 days. She states that she has a history of high blood pressure and took her pressure this morning which read 154/100. At that time patient was concerned and presents to the emergency department. Patient endorses a mild generalized headache but denies any visual acuity changes, neck pain. She does endorse some mild facial tingling that was associated with headache earlier. Patient endorsed some generalized chest discomfort but states that it has resolved. She denies any shortness of breath, cough with associated symptoms. She denies any abdominal pain, nausea vomiting, diarrhea or constipation. States that she has had some mild suprapubic tenderness but denies any dysuria or polyuria. Patient has taken her regular medications prior to arrival but has not tried anything else for her symptoms. Patient does not have a history of stroke, heart disease, diabetes.   Past Medical History  Diagnosis Date  . Migraine   . Hypertension   . Asthma     There are no active problems to display for this patient.   Past Surgical History  Procedure Laterality Date  . Cholecystectomy    . Appendectomy    . Ectopic pregnancy surgery      Current Outpatient Rx  Name  Route  Sig  Dispense  Refill  . albuterol (PROVENTIL HFA;VENTOLIN HFA) 108 (90 BASE) MCG/ACT inhaler   Inhalation   Inhale 2 puffs into the lungs every 6 (six) hours as needed. For SOB         . amLODipine  (NORVASC) 10 MG tablet   Oral   Take 10 mg by mouth daily.         . ciprofloxacin (CIPRO) 500 MG tablet   Oral   Take 1 tablet (500 mg total) by mouth 2 (two) times daily.   6 tablet   0   . citalopram (CELEXA) 10 MG tablet   Oral   Take 10 mg by mouth at bedtime.         . fluticasone (FLOVENT HFA) 220 MCG/ACT inhaler   Inhalation   Inhale 2 puffs into the lungs 2 (two) times daily.         . hydrochlorothiazide (HYDRODIURIL) 25 MG tablet   Oral   Take 25 mg by mouth daily.         Marland Kitchen labetalol (NORMODYNE) 100 MG tablet   Oral   Take 100 mg by mouth 2 (two) times daily.         . metFORMIN (GLUCOPHAGE-XR) 500 MG 24 hr tablet   Oral   Take 500 mg by mouth daily with breakfast.           Allergies Erythromycin and Guaifenesin & derivatives  History reviewed. No pertinent family history.  Social History Social History  Substance Use Topics  . Smoking status: Never Smoker   . Smokeless tobacco: None  . Alcohol Use: Yes     Review of Systems  Constitutional: No fever/chills Eyes: No visual changes.  Cardiovascular:  Intermittent chest pain that has resolved Respiratory: no cough. No SOB. Gastrointestinal: No abdominal pain.  No nausea,  no vomiting.  No diarrhea.  No constipation. Genitourinary: Negative for dysuria. No hematuria. Positive for mild suprapubic discomfort  Musculoskeletal: Negative for back pain. Skin: Negative for rash. Neurological:Positive for headache and right-sided facial tingling and denies weakness or numbness. 10-point ROS otherwise negative.  ____________________________________________   PHYSICAL EXAM:  VITAL SIGNS: ED Triage Vitals  Enc Vitals Group     BP 05/05/15 1302 133/82 mmHg     Pulse Rate 05/05/15 1302 78     Resp 05/05/15 1302 20     Temp 05/05/15 1302 98.2 F (36.8 C)     Temp Source 05/05/15 1302 Oral     SpO2 05/05/15 1302 98 %     Weight 05/05/15 1302 264 lb (119.75 kg)     Height 05/05/15 1302 5'  7" (1.702 m)     Head Cir --      Peak Flow --      Pain Score 05/05/15 1302 5     Pain Loc --      Pain Edu? --      Excl. in [place]? --      Constitutional: Alert and oriented. Well appearing and in no acute distress. Eyes: Conjunctivae are normal. PERRL. EOMI. Head: Atraumatic. Neck: No stridor.  neck is supple with full range of motion  Cardiovascular: Normal rate, regular rhythm. Normal S1 and S2.  Good peripheral circulation. no peripheral edema identified.  Respiratory: Normal respiratory effort without tachypnea or retractions. Lungs CTAB. Gastrointestinal: Soft and nontender. no guarding or rigidity.  No distention. No CVA tenderness. Musculoskeletal:Full range of motion 4 extremities Neurologic:  Normal speech and language. No gross focal neurologic deficits are appreciated.  cranial nerves II through XII grossly intact.  Skin:  Skin is warm, dry and intact. No rash noted. Psychiatric: Mood and affect are normal. Speech and behavior are normal. Patient exhibits appropriate insight and judgement.   ____________________________________________   LABS (all labs ordered are listed, but only abnormal results are displayed)  Labs Reviewed  BASIC METABOLIC PANEL - Abnormal; Notable for the following:    Calcium 8.8 (*)    All other components within normal limits  URINALYSIS COMPLETEWITH MICROSCOPIC ([facility] ONLY) - Abnormal; Notable for the following:    Color, Urine YELLOW (*)    APPearance HAZY (*)    Hgb urine dipstick 1+ (*)    Nitrite POSITIVE (*)    Bacteria, UA MANY (*)    Squamous Epithelial / LPF 0-5 (*)    All other components within normal limits  CBC  CBG MONITORING, ED   ____________________________________________  EKG  EKG reveals normal sinus rhythm at a rate of 67 bpm. No ST elevation or depression is noted. PR, QRS, QT intervals are within normal limits. No Q waves are delta waves  identified. ____________________________________________  RADIOLOGY Diamantina Providence Iran Kievit, personally viewed and evaluated these images as part of my medical decision making, as well as reviewing the written report by the radiologist.  Ct Head Wo Contrast  05/05/2015  CLINICAL DATA:  45 year old female with a history of hypertension and right sided tingling for 2 days EXAM: CT HEAD WITHOUT CONTRAST TECHNIQUE: Contiguous axial images were obtained from the base of the skull through the vertex without intravenous contrast. COMPARISON:  CT 06/05/2011, 11/03/2009 FINDINGS: Unremarkable appearance of the calvarium without acute fracture or aggressive lesion. Unremarkable appearance of the scalp soft tissues. Unremarkable appearance of the bilateral orbits. Mastoid air cells are clear. No significant paranasal sinus disease No acute intracranial hemorrhage.  No midline shift or mass effect. Gray-white differentiation is relatively maintained. Unremarkable appearance of the ventricular system. IMPRESSION: No CT evidence of acute intracranial abnormality. Signed, Dulcy Fanny. Earleen Newport, DO [facility] Electronically Signed   By: Corrie Mckusick D.O.   On: 05/05/2015 14:58    ____________________________________________    PROCEDURES  Procedure(s) performed:       Medications - No data to display   ____________________________________________   INITIAL IMPRESSION / ASSESSMENT AND PLAN / ED COURSE  Pertinent labs & imaging results that were available during my care of the patient were reviewed by me and considered in my medical decision making (see chart for details).  Patient's diagnosis is consistent withUTI and general malaise. Patient's labs are generally reassuring. Patient does have nitrites in her urine. With the associated suprapubic tenderness even without dysuria or polyuria patient will be diagnosed with urinary tract infection.  Patient had a negative head CT ruling out intracranial abnormality. Patient had intermittent chest pain that has resolved an EKG reveals normal sinus rhythm with no ST elevation or depression. Exam is generally reassuring. At this time patient will be discharged home. Patient will be discharged home with prescriptions for  Antibiotics for urinary tract infection . Patient is to follow primary care provider  if symptoms persist past this treatment course. Patient is given ED precautions to return to the ED for any worsening or new symptoms.     ____________________________________________  FINAL CLINICAL IMPRESSION(S) / ED DIAGNOSES  Final diagnoses:  UTI (lower urinary tract infection)  Malaise      NEW MEDICATIONS STARTED DURING THIS VISIT:  New Prescriptions   CIPROFLOXACIN (CIPRO) 500 MG TABLET    Take 1 tablet (500 mg total) by mouth 2 (two) times daily.        This chart was dictated using voice recognition software/Dragon. Despite best efforts to proofread, errors can occur which can change the meaning. Any change was purely unintentional.   Darletta Moll, PA-C 05/05/15 1651  Lisa Roca, MD 05/07/15 1139

## 2015-05-05 NOTE — ED Notes (Signed)
Pt to ed with c/o HTN and right sided tingling x 2 days,  Pt also reports nausea.

## 2015-12-20 IMAGING — US US PELV - US TRANSVAGINAL
1 series · 14 of 25 positions shown · non-contrast
Comparison: Report from pelvic ultrasound 05/23/2008

CLINICAL DATA: Right lower abdominal pain.

EXAM:
TRANSABDOMINAL AND TRANSVAGINAL ULTRASOUND OF PELVIS
TECHNIQUE: Both transabdominal and transvaginal ultrasound examinations of the
pelvis were performed. Transabdominal technique was performed for
global imaging of the pelvis including uterus, ovaries, adnexal
regions, and pelvic cul-de-sac. It was necessary to proceed with
endovaginal exam following the transabdominal exam to visualize the
uterus, endometrium, ovaries and adnexa .

[Series 1: us pelv - us transvaginal · 0.28mm/px · 113 acquisitions, 14 frames shown]
[im 1/113]
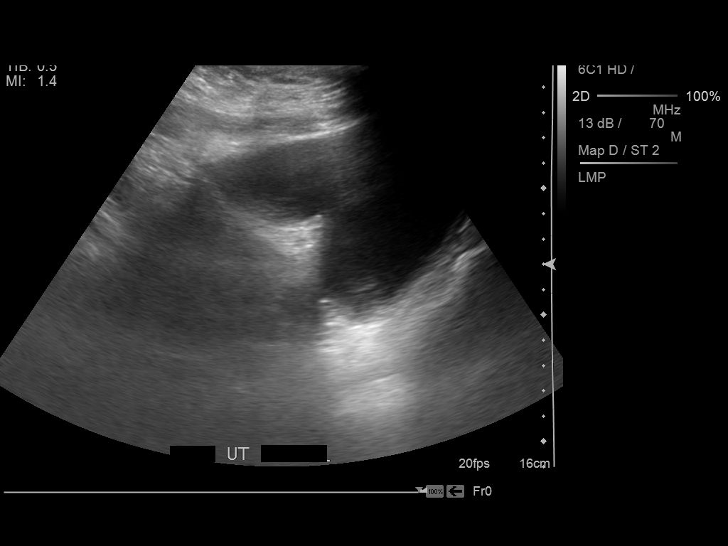
[im 10/113]
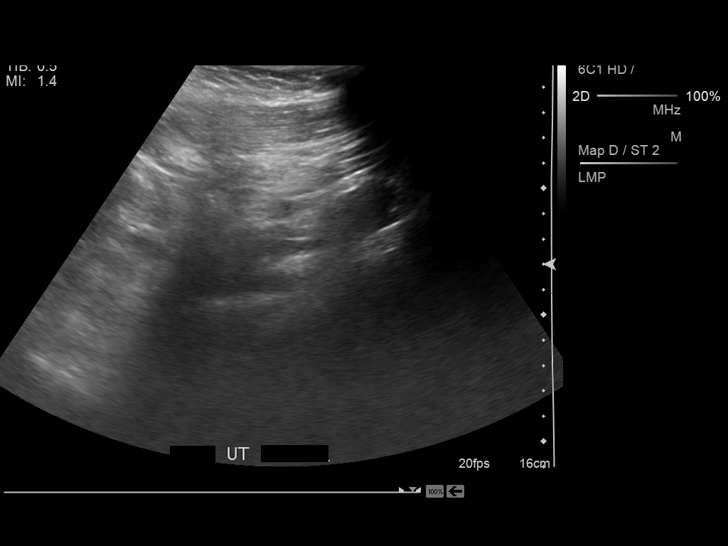
[im 19/113]
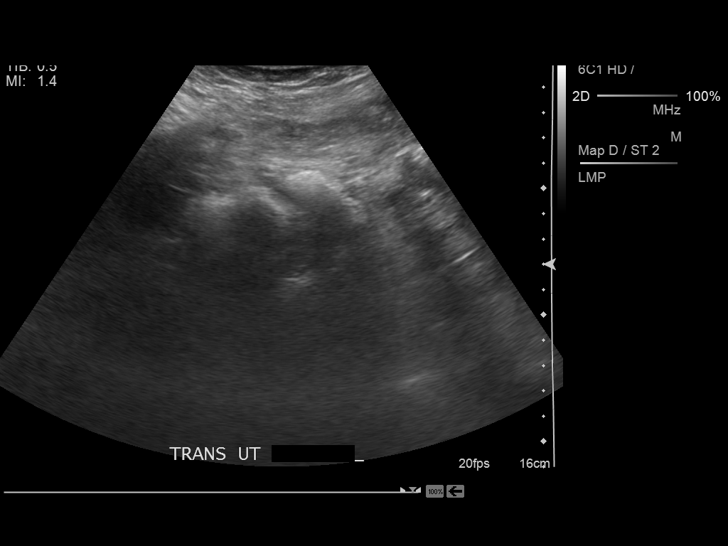
[im 29/113]
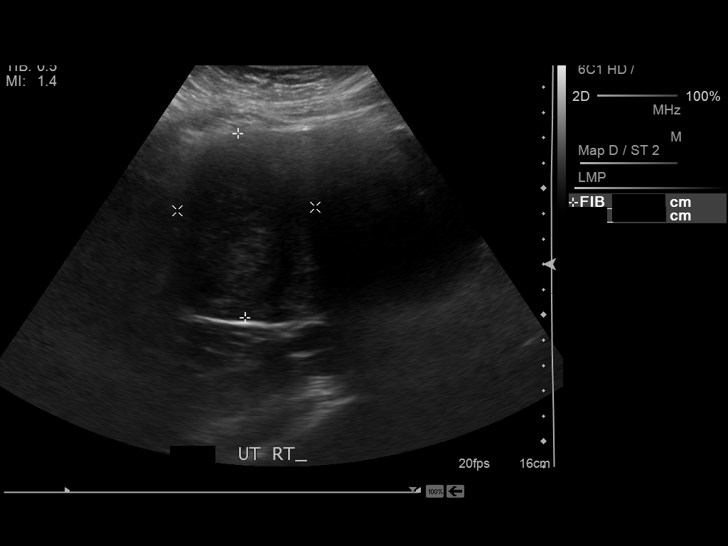
[im 38/113]
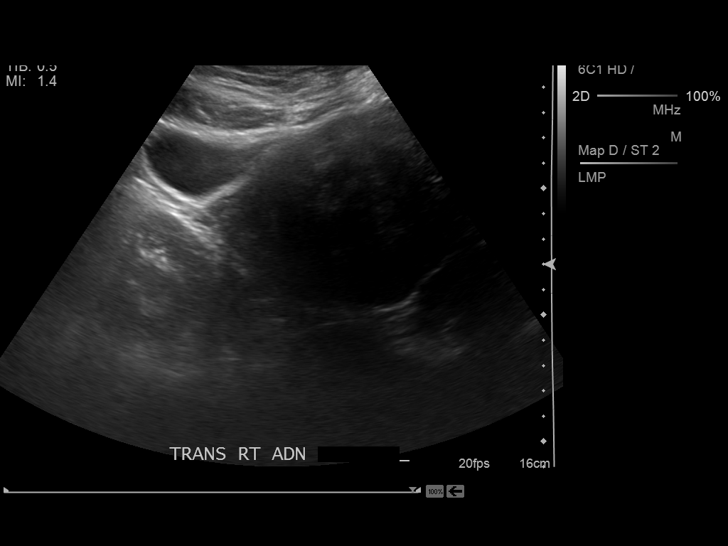
[im 43/113]
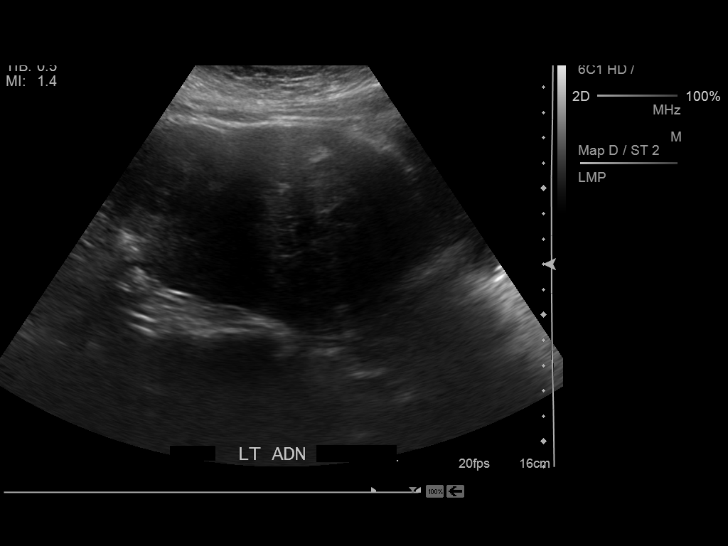
[im 52/113]
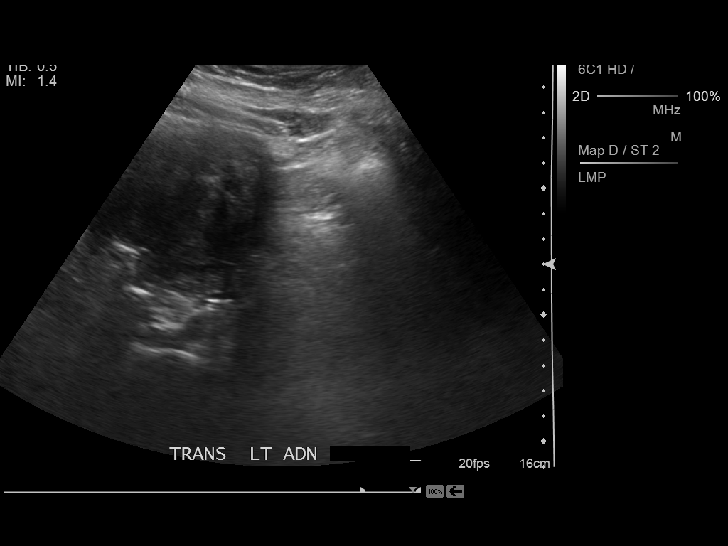
[im 61/113]
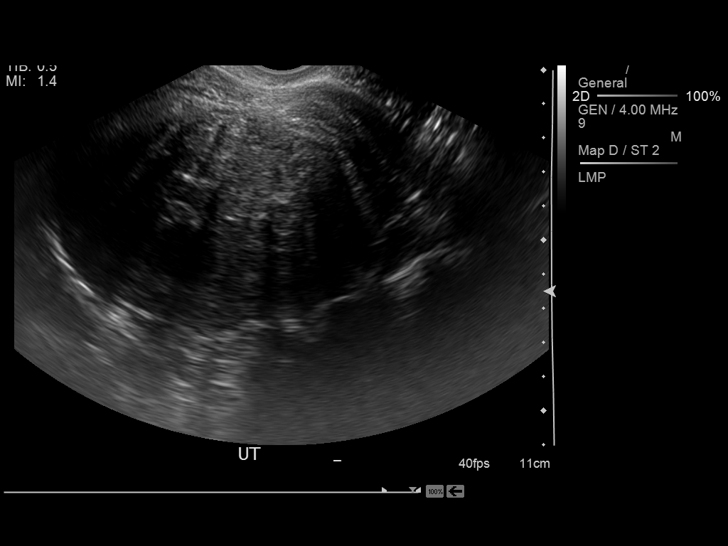
[im 71/113]
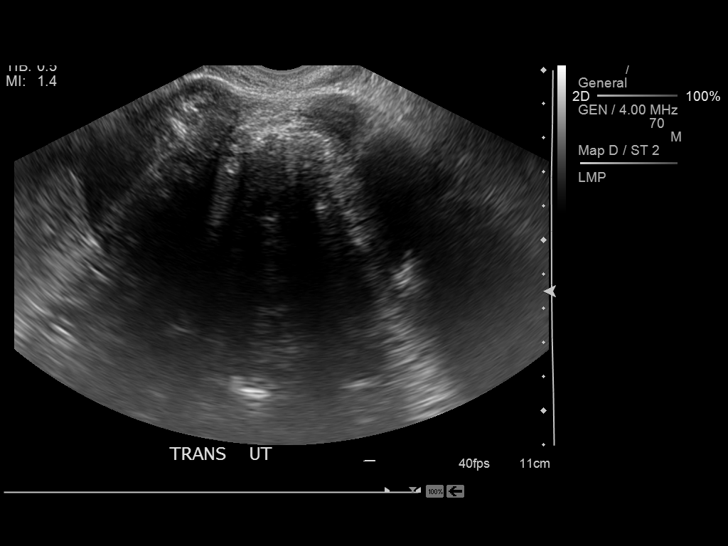
[im 75/113]
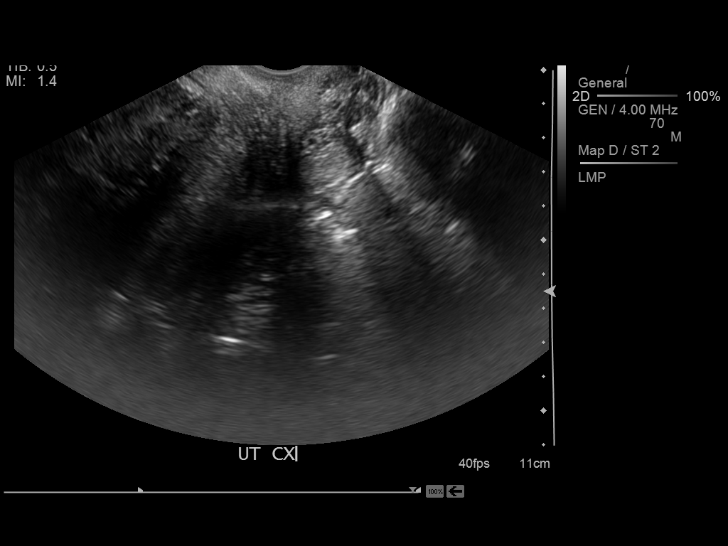
[im 85/113]
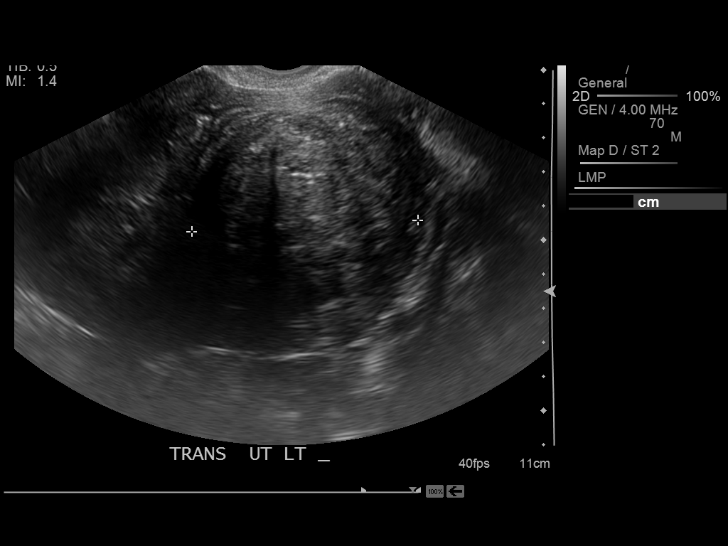
[im 94/113]
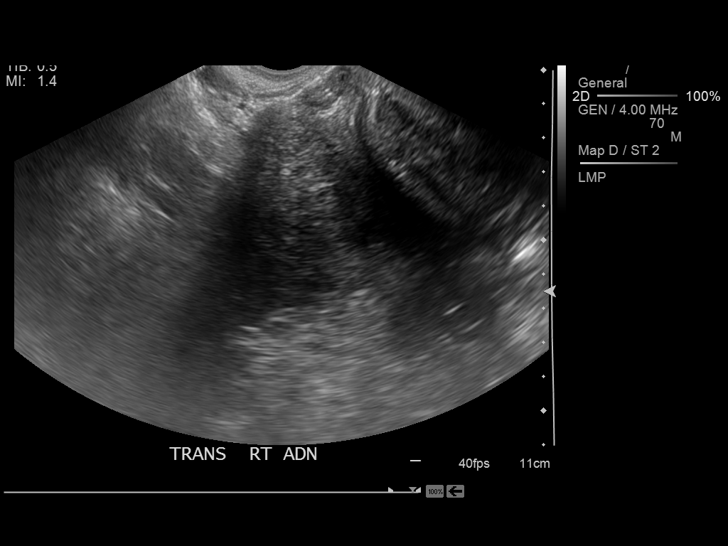
[im 103/113]
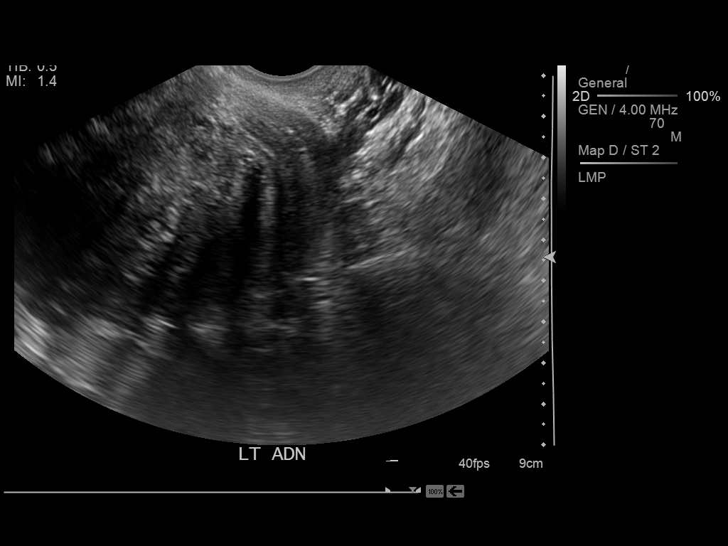
[im 113/113]
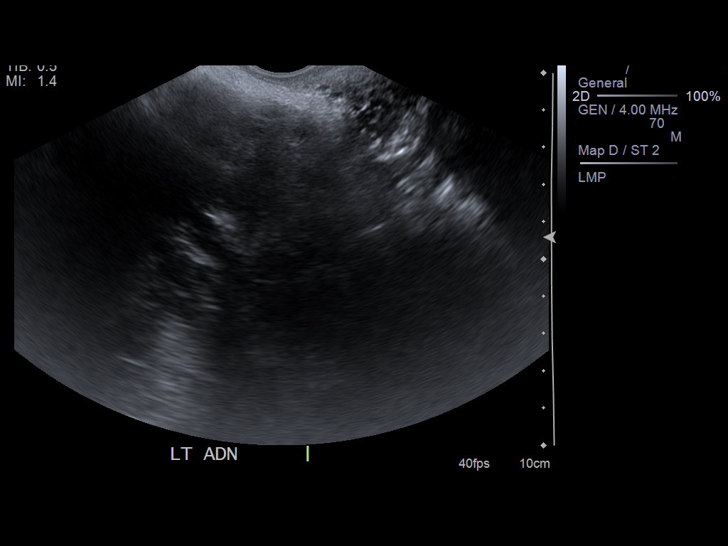

[14 of 25 positions shown; findings below may reference images not displayed]

FINDINGS: Uterus

Measurements: 15.6 x 11.9 x 7.2 cm at least 2 large fundal fibroids
are noted, the largest on the left measuring up to 8.8 cm. Right
fibroid measures up to 5.2 cm.. No fibroids or other mass
visualized.

Endometrium

Thickness: Not visualize, obscured by the large fundal fibroids.

Right ovary

Measurements: 4.6 x 1.8 x 2.2 cm. Normal appearance/no adnexal mass.

Left ovary

Measurements: Not visualized.  No adnexal mass.

Other findings

No free fluid.
IMPRESSION: At least 2 large intramural fibroids, the largest on the left
measuring up to 8.8 cm.

## 2016-06-17 ENCOUNTER — Ambulatory Visit: Payer: Self-pay | Attending: Oncology | Admitting: *Deleted

## 2016-06-17 ENCOUNTER — Encounter: Payer: Self-pay | Admitting: *Deleted

## 2016-06-17 ENCOUNTER — Ambulatory Visit
Admission: RE | Admit: 2016-06-17 | Discharge: 2016-06-17 | Disposition: A | Discharge status: Home / Self Care | Payer: Self-pay | Source: Ambulatory Visit | Attending: Oncology | Admitting: Oncology

## 2016-06-17 VITALS — BP 152/111 | HR 87 | Temp 99.2°F | Ht 66.0 in | Wt 263.0 lb

## 2016-06-17 DIAGNOSIS — Z0000 Encounter for general adult medical examination without abnormal findings: Secondary | ICD-10-CM | POA: Insufficient documentation

## 2016-06-17 NOTE — Progress Notes (Signed)
Subjective:     Patient ID: Marcia Maldonado, female   DOB: 1970-01-22, 46 y.o.   MRN: [id number]  HPI   Review of Systems     Objective:   Physical Exam  Pulmonary/Chest: Right breast exhibits no inverted nipple, no mass, no nipple discharge, no skin change and no tenderness. Left breast exhibits no inverted nipple, no mass, no nipple discharge, no skin change and no tenderness. Breasts are symmetrical.  Large pendulous breast       Assessment:     46 year old Native American female presents to [facility] for clinical breast exam and mammogram only.  Family history of breast cancer in her mother, age 47's.  States her dad also had "some type of cancer and was paralyzed" from the cancer.  Clinical breast exam unremarkable.  Taught self breast awareness. Blood pressure elevated at 152/111.  She is to take her blood pressure meds as soon as possible and recheck her blood pressure at [facility] or [facility], and if remains higher than 140/90 she is to follow-up with her primary care provider.  Hand out on hypertention given to patient. Patient has been screened for eligibility.  She does not have any insurance, Medicare or Medicaid.  She also meets financial eligibility.  Hand-out given on the Affordable Care Act.    Plan:     Screening mammogram ordered.  Will follow-up per [facility] protocol.

## 2016-06-17 NOTE — Patient Instructions (Signed)
Gave patient hand-out, Women Staying Healthy, Active and Well from [facility], with education on breast health, pap smears, heart and colon health. 

## 2016-06-28 ENCOUNTER — Encounter: Payer: Self-pay | Admitting: *Deleted

## 2016-06-28 NOTE — Progress Notes (Signed)
Letter mailed from [facility] to inform patient of her normal mammogram results.  Patient is to follow-up with annual screening in one year.  HSIS to Christy. 

## 2016-07-12 ENCOUNTER — Encounter: Payer: Self-pay | Admitting: Physician Assistant

## 2016-07-12 ENCOUNTER — Ambulatory Visit: Payer: Self-pay | Admitting: Physician Assistant

## 2016-07-12 VITALS — BP 140/90 | HR 83 | Temp 98.5°F | Resp 15

## 2016-07-12 DIAGNOSIS — L600 Ingrowing nail: Secondary | ICD-10-CM

## 2016-07-12 MED ORDER — CEPHALEXIN 500 MG PO CAPS: 500.0000 mg | ORAL_CAPSULE | Freq: Three times a day (TID) | ORAL | 0 refills | Status: DC

## 2016-07-12 NOTE — Progress Notes (Signed)
S: c/o left toe hurting, has ingrown toenail, she tried to cut it but made it worse, no drainage from the toe, area is a little red, small child keeps hitting it and making it worse, insurance with [employer] doesn't start until Aug 1  O: vitals wnl, nad, skin is red at edge of great toe, toenail is curved, ?fungal infection also, area is tender, pt walking with a limp, n/v intact  A: ingrown toenail  P: keflex 500mg  tid, x 7d; warm water soaks will refer to podiatry

## 2016-09-11 ENCOUNTER — Ambulatory Visit: Payer: Self-pay | Admitting: Physician Assistant

## 2016-09-11 VITALS — BP 130/80 | HR 93 | Temp 98.5°F | Resp 16

## 2016-09-11 DIAGNOSIS — M25562 Pain in left knee: Secondary | ICD-10-CM

## 2016-09-11 DIAGNOSIS — J0100 Acute maxillary sinusitis, unspecified: Secondary | ICD-10-CM

## 2016-09-11 MED ORDER — BENZONATATE 200 MG PO CAPS: 200.0000 mg | ORAL_CAPSULE | Freq: Three times a day (TID) | ORAL | 0 refills | Status: DC | PRN

## 2016-09-11 MED ORDER — AMOXICILLIN 875 MG PO TABS: 875.0000 mg | ORAL_TABLET | Freq: Two times a day (BID) | ORAL | 0 refills | Status: DC

## 2016-09-11 MED ORDER — METHYLPREDNISOLONE 4 MG PO TBPK: ORAL_TABLET | ORAL | 0 refills | Status: DC

## 2016-09-11 NOTE — Progress Notes (Signed)
S: C/o runny nose and congestion for 3 days, no fever, chills, cp/sob, v/d; mucus is yellow and thick, cough is sporadic, c/o of facial and dental pain. 2. C/o left knee pain, hurts to bend, squat, go up and down stairs, knee is swollen, thinks its from overactivity, hx of same, mother has same problems with her knees  Using otc meds:   O: PE: vitals wnl, nad, perrl eomi, normocephalic, tms dull, nasal mucosa red and swollen, throat injected, neck supple no lymph, lungs c t a, cv rrr, left knee is swollen and tender at medial aspect along mcl, tender at joint line, pain reproduced with bending,  neuro intact  A:  Acute sinusitis, acute knee pain   P: drink fluids, continue regular meds , use otc meds of choice, return if not improving in 5 days, return earlier if worsening, medrol dose pack, amoxil, tessalon perls

## 2016-09-30 NOTE — Progress Notes (Signed)
Patient has been referred to see Marcia Maldonado at [facility] on 09/30/16 @ 1pm.  Patient was notified and has accepted the appointment.

## 2016-12-10 ENCOUNTER — Emergency Department
Admission: EM | Admit: 2016-12-10 | Discharge: 2016-12-11 | Disposition: A | Discharge status: Home / Self Care | Payer: Managed Care, Other (non HMO) | Attending: Emergency Medicine | Admitting: Emergency Medicine

## 2016-12-10 ENCOUNTER — Emergency Department: Payer: Managed Care, Other (non HMO)

## 2016-12-10 ENCOUNTER — Encounter: Payer: Self-pay | Admitting: Emergency Medicine

## 2016-12-10 DIAGNOSIS — J45909 Unspecified asthma, uncomplicated: Secondary | ICD-10-CM | POA: Insufficient documentation

## 2016-12-10 DIAGNOSIS — I10 Essential (primary) hypertension: Secondary | ICD-10-CM | POA: Diagnosis not present

## 2016-12-10 DIAGNOSIS — M25562 Pain in left knee: Secondary | ICD-10-CM | POA: Diagnosis present

## 2016-12-10 DIAGNOSIS — Z79899 Other long term (current) drug therapy: Secondary | ICD-10-CM | POA: Diagnosis not present

## 2016-12-10 DIAGNOSIS — M2392 Unspecified internal derangement of left knee: Secondary | ICD-10-CM | POA: Diagnosis not present

## 2016-12-10 MED ORDER — HYDROCODONE-ACETAMINOPHEN 5-325 MG PO TABS: 1.0000 | ORAL_TABLET | ORAL | 0 refills | Status: DC | PRN

## 2016-12-10 NOTE — ED Provider Notes (Signed)
[facility] Emergency Department Provider Note  ____________________________________________  Time seen: Approximately 10:02 PM  I have reviewed the triage vital signs and the nursing notes.   HISTORY  Chief Complaint Knee Pain    HPI Marcia Maldonado is a 46 y.o. female who presents the emergency department complaining of acute on chronic knee pain.  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  Patient reports that she sustained 2 minor injuries to the knee over the past 24 hours.  Patient reports that this has resulted in an acute raise in her chronic knee pain.  Patient reports that typically dosing with Tylenol or Motrin relieves the pain.  She has been taking Tylenol and Motrin as well as wearing her knee brace without relief.  Patient denies any gross laxity after injury.  Patient reports that she stepped awkwardly, and landed heavily on that leg as well as twisting her knee.  She did not land on her knee.  Patient denies any numbness or tingling distally.  Pain is along the medial and lateral joint line.  She does have a history of possible meniscal tear.  Past Medical History:  Diagnosis Date  . Asthma   . Hypertension   . Migraine     Patient Active Problem List   Diagnosis Date Noted  . Asthma 08/25/2013  . HTN (hypertension) 08/25/2013  . Fibroids 08/11/2013    Past Surgical History:  Procedure Laterality Date  . APPENDECTOMY    . BREAST BIOPSY Left    neg  . CHOLECYSTECTOMY    . ECTOPIC PREGNANCY SURGERY      Prior to Admission medications   Medication Sig Start Date End Date Taking? Authorizing Provider  albuterol (PROVENTIL HFA;VENTOLIN HFA) 108 (90 BASE) MCG/ACT inhaler Inhale 2 puffs into the lungs every 6 (six) hours as needed. For SOB    [provider]  amLODipine (NORVASC) 10 MG tablet Take 10 mg by mouth daily.    [provider]  amoxicillin (AMOXIL) 875 MG tablet Take 1  tablet (875 mg total) by mouth 2 (two) times daily. 09/11/16   Fisher, Linden Dolin, PA-C  benzonatate (TESSALON) 200 MG capsule Take 1 capsule (200 mg total) by mouth 3 (three) times daily as needed for cough. 09/11/16   Fisher, Linden Dolin, PA-C  hydrochlorothiazide (HYDRODIURIL) 25 MG tablet Take 25 mg by mouth daily.    [provider]  labetalol (NORMODYNE) 100 MG tablet Take 100 mg by mouth 2 (two) times daily.    [provider]  methylPREDNISolone (MEDROL DOSEPAK) 4 MG TBPK tablet Take 6 pills on day one then decrease by 1 pill each day 09/11/16   Versie Starks, PA-C    Allergies Erythromycin and Guaifenesin & derivatives  Family History  Problem Relation Age of Onset  . Breast cancer Mother 3    Social History Social History   Tobacco Use  . Smoking status: Never Smoker  . Smokeless tobacco: Never Used  Substance Use Topics  . Alcohol use: Yes  . Drug use: No     Review of Systems  Constitutional: No fever/chills Eyes: No visual changes.  Cardiovascular: no chest pain. Respiratory: no cough. No SOB. Gastrointestinal: No abdominal pain.  No nausea, no vomiting.   Musculoskeletal: Positive for left knee pain Skin: Negative for rash, abrasions, lacerations, ecchymosis. Neurological: Negative for headaches, focal weakness or numbness. 10-point ROS otherwise negative.  ____________________________________________   PHYSICAL EXAM:  VITAL SIGNS: ED  Triage Vitals  Enc Vitals Group     BP 12/10/16 2104 (!) 183/106     Pulse Rate 12/10/16 2104 78     Resp 12/10/16 2104 18     Temp 12/10/16 2104 98.2 F (36.8 C)     Temp Source 12/10/16 2104 Oral     SpO2 12/10/16 2104 98 %     Weight 12/10/16 2103 254 lb (115.2 kg)     Height 12/10/16 2103 5\' 7"  (1.702 m)     Head Circumference --      Peak Flow --      Pain Score --      Pain Loc --      Pain Edu? --      Excl. in [place]? --      Constitutional: Alert and oriented. Well appearing and in no acute  distress. Eyes: Conjunctivae are normal. PERRL. EOMI. Head: Atraumatic. Neck: No stridor.    Cardiovascular: Normal rate, regular rhythm. Normal S1 and S2.  Good peripheral circulation. Respiratory: Normal respiratory effort without tachypnea or retractions. Lungs CTAB. Good air entry to the bases with no decreased or absent breath sounds. Musculoskeletal: Full range of motion to all extremities. No gross deformities appreciated.  Gross deformity of the left knee upon inspection.  Full range of motion.  Knee is slightly edematous when compared with other unaffected extremity.  No ballottement.  Varus, valgus, Lachman's is negative.  McMurray's is positive for lateral meniscal derangement.  Dorsalis pedis pulse and sensation intact distally. Neurologic:  Normal speech and language. No gross focal neurologic deficits are appreciated.  Skin:  Skin is warm, dry and intact. No rash noted. Psychiatric: Mood and affect are normal. Speech and behavior are normal. Patient exhibits appropriate insight and judgement.   ____________________________________________   LABS (all labs ordered are listed, but only abnormal results are displayed)  Labs Reviewed - No data to display ____________________________________________  EKG   ____________________________________________  RADIOLOGY Diamantina Providence Kasaundra Fahrney, personally viewed and evaluated these images (plain radiographs) as part of my medical decision making, as well as reviewing the written report by the radiologist.  Dg Knee Complete 4 Views Left  Result Date: 12/10/2016 CLINICAL DATA:  Left knee pain after injury stepping into a hole in parking lot this afternoon. Hyperextension injury, now with pain anterior medially. Swelling. EXAM: LEFT KNEE - COMPLETE 4+ VIEW COMPARISON:  None. FINDINGS: No evidence of fracture, dislocation, or joint effusion. Moderate tricompartmental osteoarthritis with tricompartmental peripheral spurring and subchondral  cystic change in the patellofemoral compartment. Mild lateral tibiofemoral joint space narrowing. Bone island in the proximal lateral tibia. Soft tissues are unremarkable. IMPRESSION: Moderate tricompartmental osteoarthritis without acute fracture or subluxation. Electronically Signed   By: Jeb Levering M.D.   On: 12/10/2016 21:35    ____________________________________________    PROCEDURES  Procedure(s) performed:    Procedures    Medications - No data to display   ____________________________________________   INITIAL IMPRESSION / ASSESSMENT AND PLAN / ED COURSE  Pertinent labs & imaging results that were available during my care of the patient were reviewed by me and considered in my medical decision making (see chart for details).  Review of the [place] CSRS was performed in accordance of the [place] prior to dispensing any controlled drugs.     Patient's diagnosis is consistent with derangement of the left knee.  No acute fractures or dislocations identified included contusion versus sprain versus ligament rupture versus meniscal tear versus fracture.  Exam and symptoms are  consistent with derangement of the left meniscus.  Patient is encouraged to use her knee brace, take her prescribed meloxicam.  Limited prescription for pain medication will be provided.. Patient will be discharged home with prescriptions for Vicodin. Patient is to follow up with orthopedics as needed or otherwise directed. Patient is given ED precautions to return to the ED for any worsening or new symptoms.     ____________________________________________  FINAL CLINICAL IMPRESSION(S) / ED DIAGNOSES  Final diagnoses:  None      NEW MEDICATIONS STARTED DURING THIS VISIT:  ED Discharge Orders    None          This chart was dictated using voice recognition software/Dragon. Despite best efforts to proofread, errors can occur which can change the meaning. Any change was purely  unintentional.    Darletta Moll, PA-C 12/10/16 2227    Eula Listen, MD 12/10/16 2308

## 2016-12-10 NOTE — ED Triage Notes (Signed)
Pt c/o left knee pain after stepping into a hole in parking lot. Pt reports she hyperextended the knee and now is having pain and swelling to the area.

## 2016-12-11 NOTE — ED Notes (Signed)
Patient left at 22:59 on 12/10/16 but was forgotten to be d/c out of epic. Pain score put in so patient could be d/ced out.

## 2017-03-05 ENCOUNTER — Other Ambulatory Visit: Payer: Self-pay

## 2017-03-05 ENCOUNTER — Emergency Department: Payer: Managed Care, Other (non HMO)

## 2017-03-05 ENCOUNTER — Emergency Department
Admission: EM | Admit: 2017-03-05 | Discharge: 2017-03-05 | Disposition: A | Discharge status: Home / Self Care | Payer: Managed Care, Other (non HMO) | Attending: Emergency Medicine | Admitting: Emergency Medicine

## 2017-03-05 ENCOUNTER — Encounter: Payer: Self-pay | Admitting: Emergency Medicine

## 2017-03-05 DIAGNOSIS — Z79899 Other long term (current) drug therapy: Secondary | ICD-10-CM | POA: Diagnosis not present

## 2017-03-05 DIAGNOSIS — J45909 Unspecified asthma, uncomplicated: Secondary | ICD-10-CM | POA: Insufficient documentation

## 2017-03-05 DIAGNOSIS — J111 Influenza due to unidentified influenza virus with other respiratory manifestations: Secondary | ICD-10-CM | POA: Diagnosis not present

## 2017-03-05 DIAGNOSIS — I10 Essential (primary) hypertension: Secondary | ICD-10-CM | POA: Diagnosis not present

## 2017-03-05 DIAGNOSIS — R509 Fever, unspecified: Secondary | ICD-10-CM | POA: Diagnosis present

## 2017-03-05 DIAGNOSIS — R6889 Other general symptoms and signs: Secondary | ICD-10-CM

## 2017-03-05 LAB — INFLUENZA PANEL BY PCR (TYPE A & B)
INFLAPCR: NEGATIVE
Influenza B By PCR: NEGATIVE

## 2017-03-05 MED ORDER — KETOROLAC TROMETHAMINE 60 MG/2ML IM SOLN
60.0000 mg | Freq: Once | INTRAMUSCULAR | Status: AC
  Administered 2017-03-05: 60 mg via INTRAMUSCULAR
  Filled 2017-03-05: qty 2

## 2017-03-05 MED ORDER — PSEUDOEPH-BROMPHEN-DM 30-2-10 MG/5ML PO SYRP: 5.0000 mL | ORAL_SOLUTION | Freq: Four times a day (QID) | ORAL | 0 refills | Status: DC | PRN

## 2017-03-05 MED ORDER — ONDANSETRON 8 MG PO TBDP
8.0000 mg | ORAL_TABLET | Freq: Once | ORAL | Status: AC
  Administered 2017-03-05: 8 mg via ORAL
  Filled 2017-03-05: qty 1

## 2017-03-05 MED ORDER — IBUPROFEN 800 MG PO TABS: 800.0000 mg | ORAL_TABLET | Freq: Three times a day (TID) | ORAL | 0 refills | Status: DC | PRN

## 2017-03-05 NOTE — ED Triage Notes (Signed)
Presents with cold sx's  Low grade fever and cough which started on Monday  Has been using sinus washes and sinus meds  States she developed pain between shoulder blades with cough and inspiration

## 2017-03-05 NOTE — ED Provider Notes (Signed)
[facility] Emergency Department Provider Note   ____________________________________________   First MD Initiated Contact with Patient 03/05/17 1044     (approximate)  I have reviewed the triage vital signs and the nursing notes.   HISTORY  Chief Complaint Cough and Generalized Body Aches    HPI Marcia Maldonado is a 47 y.o. female patient complained of low-grade fever, cough, generalized body aches for 2 days.  Patient also complain of nasal congestion intermittent rhinorrhea.  Patient states this pain between his shoulder blades and anterior chest with cough and deep inspirations.  Patient has taken a flu shot for this season.  Patient rates pain as a 6/10.  Patient describes the pain as "generalized aching".  No palliative measures for complaint.   Past Medical History:  Diagnosis Date  . Asthma   . Hypertension   . Migraine     Patient Active Problem List   Diagnosis Date Noted  . Asthma 08/25/2013  . HTN (hypertension) 08/25/2013  . Fibroids 08/11/2013    Past Surgical History:  Procedure Laterality Date  . APPENDECTOMY    . BREAST BIOPSY Left    neg  . CHOLECYSTECTOMY    . ECTOPIC PREGNANCY SURGERY      Prior to Admission medications   Medication Sig Start Date End Date Taking? Authorizing Provider  albuterol (PROVENTIL HFA;VENTOLIN HFA) 108 (90 BASE) MCG/ACT inhaler Inhale 2 puffs into the lungs every 6 (six) hours as needed. For SOB    [provider]  amLODipine (NORVASC) 10 MG tablet Take 10 mg by mouth daily.    [provider]  amoxicillin (AMOXIL) 875 MG tablet Take 1 tablet (875 mg total) by mouth 2 (two) times daily. 09/11/16   Fisher, Linden Dolin, PA-C  benzonatate (TESSALON) 200 MG capsule Take 1 capsule (200 mg total) by mouth 3 (three) times daily as needed for cough. 09/11/16   Versie Starks, PA-C  brompheniramine-pseudoephedrine-DM 30-2-10 MG/5ML syrup Take 5 mLs by mouth 4 (four) times daily as needed.  03/05/17   Sable Feil, PA-C  hydrochlorothiazide (HYDRODIURIL) 25 MG tablet Take 25 mg by mouth daily.    [provider]  HYDROcodone-acetaminophen (NORCO/VICODIN) 5-325 MG tablet Take 1 tablet by mouth every 4 (four) hours as needed for moderate pain. 12/10/16   Cuthriell, Charline Bills, PA-C  ibuprofen (ADVIL,MOTRIN) 800 MG tablet Take 1 tablet (800 mg total) by mouth every 8 (eight) hours as needed for moderate pain. 03/05/17   Sable Feil, PA-C  labetalol (NORMODYNE) 100 MG tablet Take 100 mg by mouth 2 (two) times daily.    [provider]  methylPREDNISolone (MEDROL DOSEPAK) 4 MG TBPK tablet Take 6 pills on day one then decrease by 1 pill each day 09/11/16   Versie Starks, PA-C    Allergies Erythromycin and Guaifenesin & derivatives  Family History  Problem Relation Age of Onset  . Breast cancer Mother 46    Social History Social History   Tobacco Use  . Smoking status: Never Smoker  . Smokeless tobacco: Never Used  Substance Use Topics  . Alcohol use: Yes  . Drug use: No    Review of Systems Constitutional: No fever/chills Eyes: No visual changes. ENT: No sore throat. Cardiovascular: Denies chest pain. Respiratory: Denies shortness of breath. Gastrointestinal: No abdominal pain.  No nausea, no vomiting.  No diarrhea.  No constipation. Genitourinary: Negative for dysuria. Musculoskeletal: Negative for back pain. Skin: Negative for rash. Neurological: Negative for headaches, focal  weakness or numbness. Endocrine:Hypertension Hematological/Lymphatic: Allergic/Immunilogical: Erythromycin and guaifenesin. ____________________________________________   PHYSICAL EXAM:  VITAL SIGNS: ED Triage Vitals [03/05/17 1045]  Enc Vitals Group     BP      Pulse      Resp      Temp      Temp src      SpO2      Weight      Height      Head Circumference      Peak Flow      Pain Score 6     Pain Loc      Pain Edu?      Excl. in [place]?     Constitutional: Alert and oriented.  Appears malaise.   Nose: Edematous nasal turbinates clear rhinorrhea.   Mouth/Throat: Mucous membranes are moist.  Oropharynx non-erythematous. Neck: No stridor.  Hematological/Lymphatic/Immunilogical: No cervical lymphadenopathy. Cardiovascular: Normal rate, regular rhythm. Grossly normal heart sounds.  Good peripheral circulation. Respiratory: Normal respiratory effort.  No retractions. Lungs CTAB. Musculoskeletal: No lower extremity tenderness nor edema.  No joint effusions. Neurologic:  Normal speech and language. No gross focal neurologic deficits are appreciated. No gait instability. Skin:  Skin is warm, dry and intact. No rash noted. Psychiatric: Mood and affect are normal. Speech and behavior are normal.  ____________________________________________   LABS (all labs ordered are listed, but only abnormal results are displayed)  Labs Reviewed  INFLUENZA PANEL BY PCR (TYPE A & B)   ____________________________________________  EKG   ____________________________________________  RADIOLOGY  ED MD interpretation: No acute findings on chest x-ray.  Official radiology report(s): Dg Chest 2 View  Result Date: 03/05/2017 CLINICAL DATA:  Low grade fever started on Monday. EXAM: CHEST  2 VIEW COMPARISON:  07/06/2012 FINDINGS: The heart size and mediastinal contours are within normal limits. Both lungs are clear. The visualized skeletal structures are unremarkable. IMPRESSION: No active cardiopulmonary disease. Electronically Signed   By: Kathreen Devoid   On: 03/05/2017 11:11    ____________________________________________   PROCEDURES  Procedure(s) performed: None  Procedures  Critical Care performed: No  ____________________________________________   INITIAL IMPRESSION / ASSESSMENT AND PLAN / ED COURSE  As part of my medical decision making, I reviewed the following data within the electronic MEDICAL RECORD NUMBER    Viral  respiratory illness.  Discussed negative flu results with patient.  Patient given discharge care instruction advised take medication as directed.  Patient given work note for 2 days.  Patient advised to follow-up PCP if no improvement in 3 days.      ____________________________________________   FINAL CLINICAL IMPRESSION(S) / ED DIAGNOSES  Final diagnoses:  Flu-like symptoms     ED Discharge Orders        Ordered    brompheniramine-pseudoephedrine-DM 30-2-10 MG/5ML syrup  4 times daily PRN     03/05/17 1241    ibuprofen (ADVIL,MOTRIN) 800 MG tablet  Every 8 hours PRN     03/05/17 1241       Note:  This document was prepared using Dragon voice recognition software and may include unintentional dictation errors.    Sable Feil, PA-C 03/05/17 1244    Lisa Roca, MD 03/05/17 [phone]

## 2017-03-06 ENCOUNTER — Ambulatory Visit: Payer: Self-pay

## 2017-05-16 ENCOUNTER — Emergency Department: Payer: Managed Care, Other (non HMO)

## 2017-05-16 ENCOUNTER — Other Ambulatory Visit: Payer: Self-pay

## 2017-05-16 ENCOUNTER — Emergency Department
Admission: EM | Admit: 2017-05-16 | Discharge: 2017-05-16 | Disposition: A | Discharge status: Home / Self Care | Payer: Managed Care, Other (non HMO) | Attending: Emergency Medicine | Admitting: Emergency Medicine

## 2017-05-16 ENCOUNTER — Encounter: Payer: Self-pay | Admitting: *Deleted

## 2017-05-16 DIAGNOSIS — I10 Essential (primary) hypertension: Secondary | ICD-10-CM | POA: Insufficient documentation

## 2017-05-16 DIAGNOSIS — M25572 Pain in left ankle and joints of left foot: Secondary | ICD-10-CM | POA: Insufficient documentation

## 2017-05-16 DIAGNOSIS — Z79899 Other long term (current) drug therapy: Secondary | ICD-10-CM | POA: Diagnosis not present

## 2017-05-16 DIAGNOSIS — J45909 Unspecified asthma, uncomplicated: Secondary | ICD-10-CM | POA: Diagnosis not present

## 2017-05-16 DIAGNOSIS — M79662 Pain in left lower leg: Secondary | ICD-10-CM | POA: Diagnosis not present

## 2017-05-16 MED ORDER — HYDROCODONE-ACETAMINOPHEN 5-325 MG PO TABS: 1.0000 | ORAL_TABLET | ORAL | 0 refills | Status: DC | PRN

## 2017-05-16 NOTE — ED Provider Notes (Signed)
[facility] Emergency Department Provider Note ____________________________________________  Time seen: Approximately 7:07 PM  I have reviewed the triage vital signs and the nursing notes.   HISTORY  Chief Complaint Ankle Pain    HPI Marcia Maldonado is a 47 y.o. female who presents to the emergency department for evaluation and treatment of left ankle pain. No known injury.  She denies recent travel, immobilization, she is not on birth control, and does not smoke.  She has no history of DVT.  No relief with ibuprofen and Tylenol.  Past Medical History:  Diagnosis Date  . Asthma   . Hypertension   . Migraine     Patient Active Problem List   Diagnosis Date Noted  . Asthma 08/25/2013  . HTN (hypertension) 08/25/2013  . Fibroids 08/11/2013    Past Surgical History:  Procedure Laterality Date  . ABDOMINAL HYSTERECTOMY    . APPENDECTOMY    . BREAST BIOPSY Left    neg  . CHOLECYSTECTOMY    . ECTOPIC PREGNANCY SURGERY      Prior to Admission medications   Medication Sig Start Date End Date Taking? Authorizing Provider  albuterol (PROVENTIL HFA;VENTOLIN HFA) 108 (90 BASE) MCG/ACT inhaler Inhale 2 puffs into the lungs every 6 (six) hours as needed. For SOB    [provider]  amLODipine (NORVASC) 10 MG tablet Take 10 mg by mouth daily.    [provider]  amoxicillin (AMOXIL) 875 MG tablet Take 1 tablet (875 mg total) by mouth 2 (two) times daily. 09/11/16   Fisher, Linden Dolin, PA-C  benzonatate (TESSALON) 200 MG capsule Take 1 capsule (200 mg total) by mouth 3 (three) times daily as needed for cough. 09/11/16   Versie Starks, PA-C  brompheniramine-pseudoephedrine-DM 30-2-10 MG/5ML syrup Take 5 mLs by mouth 4 (four) times daily as needed. 03/05/17   Sable Feil, PA-C  hydrochlorothiazide (HYDRODIURIL) 25 MG tablet Take 25 mg by mouth daily.    [provider]  HYDROcodone-acetaminophen (NORCO/VICODIN) 5-325 MG tablet Take 1  tablet by mouth every 4 (four) hours as needed for moderate pain. 05/16/17 05/16/18  Ahren Pettinger, Johnette Abraham B, FNP  ibuprofen (ADVIL,MOTRIN) 800 MG tablet Take 1 tablet (800 mg total) by mouth every 8 (eight) hours as needed for moderate pain. 03/05/17   Sable Feil, PA-C  labetalol (NORMODYNE) 100 MG tablet Take 100 mg by mouth 2 (two) times daily.    [provider]  methylPREDNISolone (MEDROL DOSEPAK) 4 MG TBPK tablet Take 6 pills on day one then decrease by 1 pill each day 09/11/16   Versie Starks, PA-C    Allergies Erythromycin and Guaifenesin & derivatives  Family History  Problem Relation Age of Onset  . Breast cancer Mother 93    Social History Social History   Tobacco Use  . Smoking status: Never Smoker  . Smokeless tobacco: Never Used  Substance Use Topics  . Alcohol use: Yes  . Drug use: No    Review of Systems Constitutional: Negative for fever. Cardiovascular: Negative for chest pain. Respiratory: Negative for shortness of breath. Musculoskeletal: Positive for left ankle pain. Skin: Positive for swelling of the left ankle Neurological: Negative for decrease in sensation  ____________________________________________   PHYSICAL EXAM:  VITAL SIGNS: ED Triage Vitals  Enc Vitals Group     BP 05/16/17 1848 (!) 155/108     Pulse Rate 05/16/17 1848 78     Resp 05/16/17 1848 18     Temp 05/16/17 1848 98.6  F (37 C)     Temp Source 05/16/17 1848 Oral     SpO2 05/16/17 1848 99 %     Weight 05/16/17 1850 259 lb (117.5 kg)     Height 05/16/17 1850 5\' 7"  (1.702 m)     Head Circumference --      Peak Flow --      Pain Score 05/16/17 1849 6     Pain Loc --      Pain Edu? --      Excl. in [place]? --     Constitutional: Alert and oriented. Well appearing and in no acute distress. Eyes: Conjunctivae are clear without discharge or drainage Head: Atraumatic Neck: Supple Respiratory: No cough. Respirations are even and unlabored. Musculoskeletal: Limited range of  motion of the left ankle secondary to pain and swelling. Neurologic: Motor and sensory function is intact, specifically of the left lower extremity. Skin: Edema without erythema or open wound or lesion is noted over the left ankle Psychiatric: Affect and behavior are appropriate.  ____________________________________________   LABS (all labs ordered are listed, but only abnormal results are displayed)  Labs Reviewed - No data to display ____________________________________________  RADIOLOGY  X-ray of the left ankle is negative for acute bony abnormalities per radiology.  Ultrasound of the left lower extremity is negative for DVT per radiology. ____________________________________________   PROCEDURES  Procedures  ____________________________________________   INITIAL IMPRESSION / ASSESSMENT AND PLAN / ED COURSE  Marcia Maldonado is a 47 y.o. who presents to the emergency department for treatment and evaluation of acute onset left ankle pain and swelling.  Differential diagnosis includes but is not limited to ankle sprain, gout, DVT.  Ultrasound and plain x-rays are negative for acute findings per radiology.  Patient was placed in an ankle stirrup splint by ER tech.  She was neurovascularly intact post application.  She will be given prescriptions for Norco and advised to continue the ibuprofen.  Patient instructed to follow-up with orthopedics if not improving over the week.  She was also instructed to return to the emergency department for symptoms that change or worsen if unable schedule an appointment with orthopedics or primary care.  Medications - No data to display  Pertinent labs & imaging results that were available during my care of the patient were reviewed by me and considered in my medical decision making (see chart for details).  _________________________________________   FINAL CLINICAL IMPRESSION(S) / ED DIAGNOSES  Final diagnoses:  Acute left ankle pain     ED Discharge Orders        Ordered    HYDROcodone-acetaminophen (NORCO/VICODIN) 5-325 MG tablet  Every 4 hours PRN     05/16/17 2053       If controlled substance prescribed during this visit, 12 month history viewed on the [place] prior to issuing an initial prescription for Schedule II or III opiod.    Victorino Dike, FNP 05/17/17 0005    Earleen Newport, MD 05/17/17 [phone]

## 2017-05-16 NOTE — ED Triage Notes (Signed)
Patient c/o left ankle pain and swelling that began today.

## 2017-07-09 ENCOUNTER — Other Ambulatory Visit: Payer: Self-pay | Admitting: Family Medicine

## 2017-07-09 DIAGNOSIS — Z1231 Encounter for screening mammogram for malignant neoplasm of breast: Secondary | ICD-10-CM

## 2017-08-13 ENCOUNTER — Ambulatory Visit
Admission: RE | Admit: 2017-08-13 | Discharge: 2017-08-13 | Disposition: A | Discharge status: Home / Self Care | Payer: Managed Care, Other (non HMO) | Source: Ambulatory Visit | Attending: Family Medicine | Admitting: Family Medicine

## 2017-08-13 DIAGNOSIS — Z1231 Encounter for screening mammogram for malignant neoplasm of breast: Secondary | ICD-10-CM | POA: Insufficient documentation

## 2017-11-04 ENCOUNTER — Encounter: Payer: Self-pay | Admitting: Emergency Medicine

## 2017-11-04 ENCOUNTER — Other Ambulatory Visit: Payer: Self-pay

## 2017-11-04 ENCOUNTER — Emergency Department
Admission: EM | Admit: 2017-11-04 | Discharge: 2017-11-05 | Disposition: A | Discharge status: Home / Self Care | Payer: Managed Care, Other (non HMO) | Attending: Emergency Medicine | Admitting: Emergency Medicine

## 2017-11-04 DIAGNOSIS — Z79899 Other long term (current) drug therapy: Secondary | ICD-10-CM | POA: Diagnosis not present

## 2017-11-04 DIAGNOSIS — Y9289 Other specified places as the place of occurrence of the external cause: Secondary | ICD-10-CM | POA: Insufficient documentation

## 2017-11-04 DIAGNOSIS — S3992XA Unspecified injury of lower back, initial encounter: Secondary | ICD-10-CM | POA: Diagnosis present

## 2017-11-04 DIAGNOSIS — J45909 Unspecified asthma, uncomplicated: Secondary | ICD-10-CM | POA: Diagnosis not present

## 2017-11-04 DIAGNOSIS — I10 Essential (primary) hypertension: Secondary | ICD-10-CM | POA: Diagnosis not present

## 2017-11-04 DIAGNOSIS — Y9389 Activity, other specified: Secondary | ICD-10-CM | POA: Diagnosis not present

## 2017-11-04 DIAGNOSIS — X500XXA Overexertion from strenuous movement or load, initial encounter: Secondary | ICD-10-CM | POA: Insufficient documentation

## 2017-11-04 DIAGNOSIS — S39012A Strain of muscle, fascia and tendon of lower back, initial encounter: Secondary | ICD-10-CM | POA: Diagnosis not present

## 2017-11-04 DIAGNOSIS — Y999 Unspecified external cause status: Secondary | ICD-10-CM | POA: Insufficient documentation

## 2017-11-04 MED ORDER — PREDNISONE 10 MG (21) PO TBPK: ORAL_TABLET | ORAL | 0 refills | Status: DC

## 2017-11-04 MED ORDER — BACLOFEN 10 MG PO TABS: 10.0000 mg | ORAL_TABLET | Freq: Three times a day (TID) | ORAL | 1 refills | Status: AC

## 2017-11-04 MED ORDER — TRAMADOL HCL 50 MG PO TABS: 50.0000 mg | ORAL_TABLET | Freq: Four times a day (QID) | ORAL | 0 refills | Status: DC | PRN

## 2017-11-04 MED ORDER — KETOROLAC TROMETHAMINE 30 MG/ML IJ SOLN
30.0000 mg | Freq: Once | INTRAMUSCULAR | Status: AC
  Administered 2017-11-04: 30 mg via INTRAVENOUS
  Filled 2017-11-04: qty 1

## 2017-11-04 MED ORDER — ORPHENADRINE CITRATE 30 MG/ML IJ SOLN
60.0000 mg | Freq: Two times a day (BID) | INTRAMUSCULAR | Status: DC
  Administered 2017-11-04: 60 mg via INTRAVENOUS
  Filled 2017-11-04: qty 2

## 2017-11-04 NOTE — Discharge Instructions (Addendum)
Follow-up with Dr. Rudene Christians.  Please call for an appointment.  Apply ice to your lower back.  If you use wet heat to loosen the muscles apply ice afterwards.  In 1 to 2 days start doing the back exercises to strengthen your core to prevent back injuries.  Take the medications as prescribed.  Return to the emergency department if you are worsening.

## 2017-11-04 NOTE — ED Provider Notes (Signed)
[facility] Emergency Department Provider Note  ____________________________________________   First MD Initiated Contact with Patient 11/04/17 2306     (approximate)  I have reviewed the triage vital signs and the nursing notes.   HISTORY  Chief Complaint Back Pain    HPI Marcia Maldonado is a 47 y.o. female  C/o low back pain for 2 days which increased today when she bent over to fix her shoe.  She states she felt a pop in the lower back at that time.  Probable known injury as she lifted her 44-year-old into the grocery cart and felt a pull the other day.  Pain is worse with movement, increased with bending over, denies numbness, tingling, or changes in bowel/urinary habits,  Using otc meds without relief.  Tried multiple doses of Tylenol without any relief while at work.  Patient works at the [employer] center and has to wear a duty belt that weighs approximately 30 pounds. Remainder ros neg   Past Medical History:  Diagnosis Date  . Asthma   . Hypertension   . Migraine     Patient Active Problem List   Diagnosis Date Noted  . Asthma 08/25/2013  . HTN (hypertension) 08/25/2013  . Fibroids 08/11/2013    Past Surgical History:  Procedure Laterality Date  . ABDOMINAL HYSTERECTOMY    . APPENDECTOMY    . BREAST BIOPSY Left    neg  . CHOLECYSTECTOMY    . ECTOPIC PREGNANCY SURGERY      Prior to Admission medications   Medication Sig Start Date End Date Taking? Authorizing Provider  albuterol (PROVENTIL HFA;VENTOLIN HFA) 108 (90 BASE) MCG/ACT inhaler Inhale 2 puffs into the lungs every 6 (six) hours as needed. For SOB    [provider]  amLODipine (NORVASC) 10 MG tablet Take 10 mg by mouth daily.    [provider]  baclofen (LIORESAL) 10 MG tablet Take 1 tablet (10 mg total) by mouth 3 (three) times daily. 11/04/17 11/04/18  Margrete Delude, Linden Dolin, PA-C  hydrochlorothiazide (HYDRODIURIL) 25 MG tablet Take 25 mg by mouth  daily.    [provider]  labetalol (NORMODYNE) 100 MG tablet Take 100 mg by mouth 2 (two) times daily.    [provider]  predniSONE (STERAPRED UNI-PAK 21 TAB) 10 MG (21) TBPK tablet Take 6 pills on day one then decrease by 1 pill each day 11/04/17   Versie Starks, PA-C  traMADol (ULTRAM) 50 MG tablet Take 1 tablet (50 mg total) by mouth every 6 (six) hours as needed. 11/04/17   Versie Starks, PA-C    Allergies Erythromycin and Guaifenesin & derivatives  Family History  Problem Relation Age of Onset  . Breast cancer Mother 34    Social History Social History   Tobacco Use  . Smoking status: Never Smoker  . Smokeless tobacco: Never Used  Substance Use Topics  . Alcohol use: Yes  . Drug use: No    Review of Systems  Constitutional: No fever/chills Eyes: No visual changes. ENT: No sore throat. Respiratory: Denies cough Genitourinary: Negative for dysuria. Musculoskeletal: Positive for back pain. Skin: Negative for rash.    ____________________________________________   PHYSICAL EXAM:  VITAL SIGNS: ED Triage Vitals  Enc Vitals Group     BP 11/04/17 2252 (!) 165/100     Pulse Rate 11/04/17 2252 74     Resp 11/04/17 2252 20     Temp 11/04/17 2252 97.9 F (36.6 C)  Temp Source 11/04/17 2252 Oral     SpO2 11/04/17 2252 98 %     Weight 11/04/17 2251 248 lb (112.5 kg)     Height 11/04/17 2251 5\' 7"  (1.702 m)     Head Circumference --      Peak Flow --      Pain Score 11/04/17 2251 8     Pain Loc --      Pain Edu? --      Excl. in [place]? --     Constitutional: Alert and oriented. Well appearing and in no acute distress. Eyes: Conjunctivae are normal.  Head: Atraumatic. Nose: No congestion/rhinnorhea. Mouth/Throat: Mucous membranes are moist.   Neck:  supple no lymphadenopathy noted Cardiovascular: Normal rate, regular rhythm. Heart sounds are normal Respiratory: Normal respiratory effort.  No retractions, lungs c t a  Abd: soft  nontender bs normal all 4 quad GU: deferred Musculoskeletal: FROM all extremities, warm and well perfused.  Decreased rom of back due to discomfort, patient is having difficulty going from sitting and standing.  Lumbar spine nontender, unable to test Slr, full strength in great toes b/l, full strength in lower legs, n/v intact Neurologic:  Normal speech and language.  Skin:  Skin is warm, dry and intact. No rash noted. Psychiatric: Mood and affect are normal. Speech and behavior are normal.  ____________________________________________   LABS (all labs ordered are listed, but only abnormal results are displayed)  Labs Reviewed - No data to display ____________________________________________   ____________________________________________  RADIOLOGY    ____________________________________________   PROCEDURES  Procedure(s) performed: Toradol 30 mg IV, Norflex 60 mg IV  Procedures    ____________________________________________   INITIAL IMPRESSION / ASSESSMENT AND PLAN / ED COURSE  Pertinent labs & imaging results that were available during my care of the patient were reviewed by me and considered in my medical decision making (see chart for details).   Patient is a 47 year old female presents emergency department complaining of low back pain after bending over and feeling a pop in the lower back.  She denies any numbness or tingling.  On physical exam patient does appear to be quite uncomfortable.  She has difficulty going from sitting to standing.  The spine is nontender.  She has full strength in lower extremities.  Saline lock, Toradol 30 mg IV, Norflex 60 mg IV    ----------------------------------------- 11:45 PM on 11/04/2017 -----------------------------------------  Patient got relief with medication.  She is very drowsy from the Norflex.  She agrees that at this time x-rays are unwarranted.  She will follow-up with Dr. Rudene Christians as she already sees him for her  knee.  She was given a prescription for a steroid pack and baclofen.  She is to return to the emergency department if worsening.  Apply ice to the lower back.  Today she can apply wet heat followed by ice.  She states she understands will comply she is given a work note for [employer].  She was discharged in stable condition in the care of her husband.  As part of my medical decision making, I reviewed the following data within the [place] History obtained from family, Nursing notes reviewed and incorporated, Old chart reviewed, Notes from prior ED visits and [place] Controlled Substance Database  ____________________________________________   FINAL CLINICAL IMPRESSION(S) / ED DIAGNOSES  Final diagnoses:  Strain of lumbar region, initial encounter      NEW MEDICATIONS STARTED DURING THIS VISIT:  New Prescriptions   BACLOFEN (LIORESAL) 10 MG  TABLET    Take 1 tablet (10 mg total) by mouth 3 (three) times daily.   PREDNISONE (STERAPRED UNI-PAK 21 TAB) 10 MG (21) TBPK TABLET    Take 6 pills on day one then decrease by 1 pill each day   TRAMADOL (ULTRAM) 50 MG TABLET    Take 1 tablet (50 mg total) by mouth every 6 (six) hours as needed.     Note:  This document was prepared using Dragon voice recognition software and may include unintentional dictation errors.     Versie Starks, PA-C 11/04/17 2350    Arta Silence, MD 11/05/17 [phone]

## 2017-11-04 NOTE — ED Triage Notes (Addendum)
Pt to triage via w/c, appears uncomfortable; pt reports was standing up to pull pants on and put belt on; felt pop in right lower back, nonradiating and having pain since, like "muscle spasms" that increase with deep breathing or movement; st few days ago picked up her heavy 47yr old as well causing pain; took 2 acetaminophen PTA without relief at 6pm

## 2018-01-08 ENCOUNTER — Ambulatory Visit: Payer: Self-pay | Admitting: Emergency Medicine

## 2018-01-08 VITALS — BP 158/100 | HR 92 | Temp 99.0°F | Resp 16

## 2018-01-08 DIAGNOSIS — R05 Cough: Secondary | ICD-10-CM

## 2018-01-08 DIAGNOSIS — J209 Acute bronchitis, unspecified: Secondary | ICD-10-CM

## 2018-01-08 DIAGNOSIS — R059 Cough, unspecified: Secondary | ICD-10-CM

## 2018-01-08 DIAGNOSIS — J101 Influenza due to other identified influenza virus with other respiratory manifestations: Secondary | ICD-10-CM

## 2018-01-08 LAB — POCT INFLUENZA A/B
Influenza A, POC: POSITIVE — AB
Influenza B, POC: NEGATIVE

## 2018-01-08 MED ORDER — ONDANSETRON 4 MG PO TBDP: 4.0000 mg | ORAL_TABLET | Freq: Three times a day (TID) | ORAL | 0 refills | Status: DC | PRN

## 2018-01-08 MED ORDER — DOXYCYCLINE HYCLATE 100 MG PO CAPS: 100.0000 mg | ORAL_CAPSULE | Freq: Two times a day (BID) | ORAL | 0 refills | Status: DC

## 2018-01-08 MED ORDER — PROMETHAZINE-CODEINE 6.25-10 MG/5ML PO SYRP: ORAL_SOLUTION | ORAL | 0 refills | Status: AC

## 2018-01-08 MED ORDER — OSELTAMIVIR PHOSPHATE 75 MG PO CAPS: ORAL_CAPSULE | ORAL | 0 refills | Status: DC

## 2018-01-08 NOTE — Patient Instructions (Addendum)
Your flu test is positive for influenza A. Diagnosis is influenza and also bronchitis, so treating with Tamiflu for the influenza and doxycycline for bronchitis. May use Delsym for mild cough.  I am also prescribing promethazine with codeine cough medicine if needed for severe cough, but caution because it may cause drowsiness. Prescriptions already sent to your pharmacy. Also, please read attached instruction sheet on influenza.  May not work from 12/26-12/29/19. May return to work as of 01/12/2018 if feeling better and you do not have a fever for 24 hours.   Influenza, Adult Influenza, more commonly known as "the flu," is a viral infection that mainly affects the respiratory tract. The respiratory tract includes organs that help you breathe, such as the lungs, nose, and throat. The flu causes many symptoms similar to the common cold along with high fever and body aches. The flu spreads easily from person to person (is contagious). Getting a flu shot (influenza vaccination) every year is the best way to prevent the flu. What are the causes? This condition is caused by the influenza virus. You can get the virus by:  Breathing in droplets that are in the air from an infected person's cough or sneeze.  Touching something that has been exposed to the virus (has been contaminated) and then touching your mouth, nose, or eyes. What increases the risk? The following factors may make you more likely to get the flu:  Not washing or sanitizing your hands often.  Having close contact with many people during cold and flu season.  Touching your mouth, eyes, or nose without first washing or sanitizing your hands.  Not getting a yearly (annual) flu shot. You may have a higher risk for the flu, including serious problems such as a lung infection (pneumonia), if you:  Are older than 65.  Are pregnant.  Have a weakened disease-fighting system (immune system). You may have a weakened immune system if  you: ? Have HIV or AIDS. ? Are undergoing chemotherapy. ? Are taking medicines that reduce (suppress) the activity of your immune system.  Have a long-term (chronic) illness, such as heart disease, kidney disease, diabetes, or lung disease.  Have a liver disorder.  Are severely overweight (morbidly obese).  Have anemia. This is a condition that affects your red blood cells.  Have asthma. What are the signs or symptoms? Symptoms of this condition usually begin suddenly and last 4-14 days. They may include:  Fever and chills.  Headaches, body aches, or muscle aches.  Sore throat.  Cough.  Runny or stuffy (congested) nose.  Chest discomfort.  Poor appetite.  Weakness or fatigue.  Dizziness.  Nausea or vomiting. How is this diagnosed? This condition may be diagnosed based on:  Your symptoms and medical history.  A physical exam.  Swabbing your nose or throat and testing the fluid for the influenza virus. How is this treated? If the flu is diagnosed early, you can be treated with medicine that can help reduce how severe the illness is and how long it lasts (antiviral medicine). This may be given by mouth (orally) or through an IV. Taking care of yourself at home can help relieve symptoms. Your health care provider may recommend:  Taking over-the-counter medicines.  Drinking plenty of fluids. In many cases, the flu goes away on its own. If you have severe symptoms or complications, you may be treated in a hospital. Follow these instructions at home: Activity  Rest as needed and get plenty of sleep.  Stay home  from work or school as told by your health care provider. Unless you are visiting your health care provider, avoid leaving home until your fever has been gone for 24 hours without taking medicine. Eating and drinking  Take an oral rehydration solution (ORS). This is a drink that is sold at pharmacies and retail stores.  Drink enough fluid to keep your  urine pale yellow.  Drink clear fluids in small amounts as you are able. Clear fluids include water, ice chips, diluted fruit juice, and low-calorie sports drinks.  Eat bland, easy-to-digest foods in small amounts as you are able. These foods include bananas, applesauce, rice, lean meats, toast, and crackers.  Avoid drinking fluids that contain a lot of sugar or caffeine, such as energy drinks, regular sports drinks, and soda.  Avoid alcohol.  Avoid spicy or fatty foods. General instructions      Take over-the-counter and prescription medicines only as told by your health care provider.  Use a cool mist humidifier to add humidity to the air in your home. This can make it easier to breathe.  Cover your mouth and nose when you cough or sneeze.  Wash your hands with soap and water often, especially after you cough or sneeze. If soap and water are not available, use alcohol-based hand sanitizer.  Keep all follow-up visits as told by your health care provider. This is important. How is this prevented?   Get an annual flu shot. You may get the flu shot in late summer, fall, or winter. Ask your health care provider when you should get your flu shot.  Avoid contact with people who are sick during cold and flu season. This is generally fall and winter. Contact a health care provider if:  You develop new symptoms.  You have: ? Chest pain. ? Diarrhea. ? A fever.  Your cough gets worse.  You produce more mucus.  You feel nauseous or you vomit. Get help right away if:  You develop shortness of breath or difficulty breathing.  Your skin or nails turn a bluish color.  You have severe pain or stiffness in your neck.  You develop a sudden headache or sudden pain in your face or ear.  You cannot eat or drink without vomiting. Summary  Influenza, more commonly known as "the flu," is a viral infection that primarily affects your respiratory tract.  Symptoms of the flu usually  begin suddenly and last 4-14 days.  Getting an annual flu shot is the best way to prevent getting the flu.  Stay home from work or school as told by your health care provider. Unless you are visiting your health care provider, avoid leaving home until your fever has been gone for 24 hours without taking medicine.  Keep all follow-up visits as told by your health care provider. This is important. This information is not intended to replace advice given to you by your health care provider. Make sure you discuss any questions you have with your health care provider. Document Released: 12/29/1999 Document Revised: 06/18/2017 Document Reviewed: 06/18/2017 Elsevier Interactive Patient Education  2019 [employer].

## 2018-01-08 NOTE — Progress Notes (Signed)
[facility]   Patient ID: Marcia Maldonado DOB: 47 y.o. MRN: [id number]   Subjective:  FLU  HPI : Acute onset of flu symptoms 3 days ago, which are progressively worsening.   Fever to 102 with chills, sweats, myalgias, fatigue, headache. Symptoms are progressively worsening, despite trying OTC fever reducing medicine and rest and fluids.  Complains of nausea without vomiting. Has decreased appetite, but tolerating some liquids by mouth. No history of recent tick bite.  Review of Systems: Positive for fatigue, mild nasal congestion,  mild swollen anterior neck glands, progressively worsening cough, occasionally productive of discolored sputum. Negative for sore throat, acute vision changes, stiff neck, focal weakness, syncope, seizures, respiratory distress, vomiting, diarrhea, GU symptoms, new rash.  Pertinent items noted in HPI and remainder of comprehensive ROS otherwise negative.   Objective: Blood pressure (!) 158/100, pulse 92, temperature 99 F (37.2 C), temperature source Oral, resp. rate 16, last menstrual period 06/04/2012, SpO2 98 %.  Physical Exam Vitals signs and nursing note reviewed.  Constitutional:      General: Marcia Maldonado is not in acute distress.    Appearance: Marcia Maldonado is well-developed. Marcia Maldonado is ill-appearing (very fatigued, but no cardiorespiratory distress) and diaphoretic. Marcia Maldonado is not toxic-appearing.  HENT:     Head: Normocephalic and atraumatic.     Right Ear: Tympanic membrane and external ear normal.     Left Ear: Tympanic membrane and external ear normal.     Nose: Rhinorrhea (Minimal) present.     Mouth/Throat:     Pharynx: No oropharyngeal exudate or posterior oropharyngeal erythema.  Eyes:     General: No scleral icterus.       Right eye: No discharge.        Left eye: No discharge.     Conjunctiva/sclera: Conjunctivae normal.  Neck:     Musculoskeletal: Neck supple.  Cardiovascular:     Rate and Rhythm: Normal  rate and regular rhythm.     Heart sounds: Normal heart sounds. No murmur.  Pulmonary:     Effort: No respiratory distress.     Breath sounds: No stridor. Rhonchi (Harsh rhonchi diffusely) present. No wheezing or rales.  Abdominal:     Palpations: Abdomen is soft.     Tenderness: There is no abdominal tenderness.  Lymphadenopathy:     Cervical: Cervical adenopathy (mild shoddy anterior cervical nodes) present.  Skin:    General: Skin is warm.     Findings: No rash.  Neurological:     Mental Status: Marcia Maldonado is alert.   No focal neurologic deficit.  Cranial nerves intact.  Today, rapid flu test is positive for influenza A  Assessment: Influenza A with respiratory manifestations  Acute bronchitis-harsh rhonchi throughout, although oxygen saturation normal, I am concerned Marcia Maldonado likely also has a secondary bacterial bronchitis.  Plan:  Treatment options discussed, as well as risks, benefits, alternatives. Patient voiced understanding and agreement with the following plans: Doxycycline for antibiotic coverage of bronchitis.  Tamiflu to treat influenza A.   New Prescriptions   DOXYCYCLINE (VIBRAMYCIN) 100 MG CAPSULE    Take 1 capsule (100 mg total) by mouth 2 (two) times daily.   ONDANSETRON (ZOFRAN-ODT) 4 MG DISINTEGRATING TABLET    Take 1 tablet (4 mg total) by mouth every 8 (eight) hours as needed for nausea or vomiting.   OSELTAMIVIR (TAMIFLU) 75 MG CAPSULE    Starting today, take 1 capsule by mouth twice a day for 5 days.   PROMETHAZINE-CODEINE (PHENERGAN WITH CODEINE)  6.25-10 MG/5ML SYRUP    Take 1-2 teaspoons every 6 hours as needed for severe cough. May cause drowsiness.     I went to the PMP website, reviewed Rx history.  In my opinion, the benefits of prescribing this 1 prescription (of promethazine with codeine) outweigh the risks. Follow-up with your primary care doctor in 5-7 days if not improving, or sooner if symptoms become worse. An After Visit Summary was printed and  given to the patient.  Your flu test is positive for influenza A. Diagnosis is influenza and also bronchitis, so treating with Tamiflu for the influenza and doxycycline for bronchitis. May use Delsym for mild cough.  I am also prescribing promethazine with codeine cough medicine if needed for severe cough, but caution because it may cause drowsiness. Prescriptions already sent to your pharmacy. Also, please read attached instruction sheet on influenza.  May not work from 12/26-12/29/19. May return to work as of 01/12/2018 if feeling better and you do not have a fever for 24 hours.  Above AVS was printed, and given to patient and questions invited and answered. Marcia Maldonado requested something for nausea, and I then a E prescribed Zofran as needed nausea. Precautions discussed with patient. Red flags discussed.  Discussed to seek medical care immediately if any red flags or other serious or new symptoms. Questions invited and answered. Patient voiced understanding and agreement.

## 2018-05-10 ENCOUNTER — Other Ambulatory Visit: Payer: Self-pay

## 2018-05-10 ENCOUNTER — Emergency Department: Payer: Managed Care, Other (non HMO)

## 2018-05-10 ENCOUNTER — Emergency Department
Admission: EM | Admit: 2018-05-10 | Discharge: 2018-05-10 | Disposition: A | Discharge status: Home / Self Care | Payer: Managed Care, Other (non HMO) | Attending: Emergency Medicine | Admitting: Emergency Medicine

## 2018-05-10 ENCOUNTER — Encounter: Payer: Self-pay | Admitting: Emergency Medicine

## 2018-05-10 DIAGNOSIS — J45909 Unspecified asthma, uncomplicated: Secondary | ICD-10-CM | POA: Diagnosis not present

## 2018-05-10 DIAGNOSIS — R1032 Left lower quadrant pain: Secondary | ICD-10-CM | POA: Insufficient documentation

## 2018-05-10 DIAGNOSIS — R1030 Lower abdominal pain, unspecified: Secondary | ICD-10-CM | POA: Diagnosis present

## 2018-05-10 DIAGNOSIS — I10 Essential (primary) hypertension: Secondary | ICD-10-CM | POA: Insufficient documentation

## 2018-05-10 DIAGNOSIS — Z79899 Other long term (current) drug therapy: Secondary | ICD-10-CM | POA: Insufficient documentation

## 2018-05-10 LAB — COMPREHENSIVE METABOLIC PANEL
ALT: 28 U/L (ref 0–44)
AST: 27 U/L (ref 15–41)
Albumin: 3.9 g/dL (ref 3.5–5.0)
Alkaline Phosphatase: 69 U/L (ref 38–126)
Anion gap: 7 (ref 5–15)
BUN: 10 mg/dL (ref 6–20)
CO2: 27 mmol/L (ref 22–32)
Calcium: 8.6 mg/dL — ABNORMAL LOW (ref 8.9–10.3)
Chloride: 105 mmol/L (ref 98–111)
Creatinine, Ser: 0.82 mg/dL (ref 0.44–1.00)
GFR calc Af Amer: >60 mL/min (ref >60)
GFR calc non Af Amer: >60 mL/min (ref >60)
Glucose, Bld: 85 mg/dL (ref 70–99)
Potassium: 3.5 mmol/L (ref 3.5–5.1)
Sodium: 139 mmol/L (ref 135–145)
Total Bilirubin: 1 mg/dL (ref 0.3–1.2)
Total Protein: 7.5 g/dL (ref 6.5–8.1)

## 2018-05-10 LAB — CBC
HCT: 45.6 % (ref 36.0–46.0)
Hemoglobin: 15.2 g/dL — ABNORMAL HIGH (ref 12.0–15.0)
MCH: 30.2 pg (ref 26.0–34.0)
MCHC: 33.3 g/dL (ref 30.0–36.0)
MCV: 90.5 fL (ref 80.0–100.0)
Platelets: 166 10*3/uL (ref 150–400)
RBC: 5.04 MIL/uL (ref 3.87–5.11)
RDW: 13.2 % (ref 11.5–15.5)
WBC: 4.9 10*3/uL (ref 4.0–10.5)
nRBC: 0 % (ref 0.0–0.2)

## 2018-05-10 LAB — URINALYSIS, COMPLETE (UACMP) WITH MICROSCOPIC
Bacteria, UA: NONE SEEN
Bilirubin Urine: NEGATIVE
Glucose, UA: NEGATIVE mg/dL
Hgb urine dipstick: NEGATIVE
Ketones, ur: NEGATIVE mg/dL
Leukocytes,Ua: NEGATIVE
Nitrite: NEGATIVE
Protein, ur: NEGATIVE mg/dL
Specific Gravity, Urine: 1.014 (ref 1.005–1.030)
pH: 6 (ref 5.0–8.0)

## 2018-05-10 LAB — LIPASE, BLOOD: Lipase: 31 U/L (ref 11–51)

## 2018-05-10 MED ORDER — MORPHINE SULFATE (PF) 4 MG/ML IV SOLN
4.0000 mg | Freq: Once | INTRAVENOUS | Status: AC
  Administered 2018-05-10: 11:00:00 4 mg via INTRAVENOUS
  Filled 2018-05-10: qty 1

## 2018-05-10 MED ORDER — IOHEXOL 300 MG/ML  SOLN
125.0000 mL | Freq: Once | INTRAMUSCULAR | Status: AC | PRN
  Administered 2018-05-10: 125 mL via INTRAVENOUS

## 2018-05-10 MED ORDER — IOHEXOL 240 MG/ML SOLN
50.0000 mL | Freq: Once | INTRAMUSCULAR | Status: AC
  Administered 2018-05-10: 11:00:00 50 mL via ORAL

## 2018-05-10 MED ORDER — KETOROLAC TROMETHAMINE 30 MG/ML IJ SOLN
30.0000 mg | Freq: Once | INTRAMUSCULAR | Status: AC
  Administered 2018-05-10: 11:00:00 30 mg via INTRAVENOUS
  Filled 2018-05-10: qty 1

## 2018-05-10 MED ORDER — ONDANSETRON HCL 4 MG/2ML IJ SOLN
4.0000 mg | Freq: Once | INTRAMUSCULAR | Status: AC
  Administered 2018-05-10: 4 mg via INTRAVENOUS
  Filled 2018-05-10: qty 2

## 2018-05-10 NOTE — ED Provider Notes (Signed)
[facility] Emergency Department Provider Note   ____________________________________________   First MD Initiated Contact with Patient 05/10/18 1024     (approximate)  I have reviewed the triage vital signs and the nursing notes.   HISTORY  Chief Complaint Abdominal Pain    HPI Marcia Maldonado is a 48 y.o. female who was at work when she began experience a sudden very sharp pain located in her lower abdomen just a little bit to the left and around her bladder area.  She reports she has had her appendix taken out she had her gallbladder removed and she is had a hysterectomy.  She denies any nausea or vomiting.  No fevers or chills.  Pain was very sudden onset located just to the left of the bladder region.  Moderate to severe in intensity and feels sharp  No fevers or chills.  No recent illness.  She was at work in her normal state of health.  She had a little bit of nausea earlier today without was because she had a typical migraine yesterday that is now gone.   Past Medical History:  Diagnosis Date  . Asthma   . Hypertension   . Migraine     Patient Active Problem List   Diagnosis Date Noted  . Asthma 08/25/2013  . HTN (hypertension) 08/25/2013  . Fibroids 08/11/2013    Past Surgical History:  Procedure Laterality Date  . ABDOMINAL HYSTERECTOMY    . APPENDECTOMY    . BREAST BIOPSY Left    neg  . CHOLECYSTECTOMY    . ECTOPIC PREGNANCY SURGERY      Prior to Admission medications   Medication Sig Start Date End Date Taking? Authorizing Provider  albuterol (PROVENTIL HFA;VENTOLIN HFA) 108 (90 BASE) MCG/ACT inhaler Inhale 2 puffs into the lungs every 6 (six) hours as needed. For SOB    [provider]  amLODipine (NORVASC) 10 MG tablet Take 10 mg by mouth daily.    [provider]  baclofen (LIORESAL) 10 MG tablet Take 1 tablet (10 mg total) by mouth 3 (three) times daily. 11/04/17 11/04/18  Fisher, Linden Dolin, PA-C   doxycycline (VIBRAMYCIN) 100 MG capsule Take 1 capsule (100 mg total) by mouth 2 (two) times daily. 01/08/18   Jacqulyn Cane, MD  hydrochlorothiazide (HYDRODIURIL) 25 MG tablet Take 25 mg by mouth daily.    [provider]  labetalol (NORMODYNE) 100 MG tablet Take 100 mg by mouth 2 (two) times daily.    [provider]  ondansetron (ZOFRAN-ODT) 4 MG disintegrating tablet Take 1 tablet (4 mg total) by mouth every 8 (eight) hours as needed for nausea or vomiting. 01/08/18   Jacqulyn Cane, MD  oseltamivir (TAMIFLU) 75 MG capsule Starting today, take 1 capsule by mouth twice a day for 5 days. 01/08/18   Jacqulyn Cane, MD  promethazine-codeine [facility] WITH CODEINE) 6.25-10 MG/5ML syrup Take 1-2 teaspoons every 6 hours as needed for severe cough. May cause drowsiness. 01/08/18   Jacqulyn Cane, MD    Allergies Erythromycin; Phenylephrine-guaifenesin; Guaifenesin; and Guaifenesin & derivatives  Family History  Problem Relation Age of Onset  . Breast cancer Mother 32    Social History Social History   Tobacco Use  . Smoking status: Never Smoker  . Smokeless tobacco: Never Used  Substance Use Topics  . Alcohol use: Yes  . Drug use: No    Review of Systems Constitutional: No fever/chills Eyes: No visual changes. ENT: No sore throat. Cardiovascular: Denies chest pain. Respiratory:  Denies shortness of breath. Gastrointestinal: See HPI Genitourinary: Negative for dysuria.  No vaginal discharge. Musculoskeletal: Negative for back pain. Skin: Negative for rash. Neurological: Negative for headache today but did have a typical migraine for her yesterday that went away. No areas of focal weakness or numbness.    ____________________________________________   PHYSICAL EXAM:  VITAL SIGNS: ED Triage Vitals  Enc Vitals Group     BP 05/10/18 1016 (!) 156/92     Pulse Rate 05/10/18 1016 77     Resp 05/10/18 1016 (!) 24     Temp 05/10/18 1016 98.3 F (36.8 C)      Temp Source 05/10/18 1016 Oral     SpO2 05/10/18 1016 93 %     Weight 05/10/18 1017 253 lb (114.8 kg)     Height 05/10/18 1017 5\' 7"  (1.702 m)     Head Circumference --      Peak Flow --      Pain Score 05/10/18 1017 8     Pain Loc --      Pain Edu? --      Excl. in [place]? --     Constitutional: Alert and oriented. Well appearing and in no acute distress she does appear in pain with her hand over her left lower pelvic region. Eyes: Conjunctivae are normal. Head: Atraumatic. Nose: No congestion/rhinnorhea. Mouth/Throat: Mucous membranes are moist. Neck: No stridor.  Cardiovascular: Normal rate, regular rhythm. Grossly normal heart sounds.  Good peripheral circulation. Respiratory: Normal respiratory effort.  No retractions. Lungs CTAB. Gastrointestinal: Soft and nontender except in the left lower quadrant where she has moderate tenderness without rebound or guarding. No distention. Musculoskeletal: No lower extremity tenderness nor edema. Neurologic:  Normal speech and language. No gross focal neurologic deficits are appreciated.  Skin:  Skin is warm, dry and intact. No rash noted. Psychiatric: Mood and affect are normal. Speech and behavior are normal.  ____________________________________________   LABS (all labs ordered are listed, but only abnormal results are displayed)  Labs Reviewed  CBC - Abnormal; Notable for the following components:      Result Value   Hemoglobin 15.2 (*)    All other components within normal limits  COMPREHENSIVE METABOLIC PANEL - Abnormal; Notable for the following components:   Calcium 8.6 (*)    All other components within normal limits  URINALYSIS, COMPLETE (UACMP) WITH MICROSCOPIC - Abnormal; Notable for the following components:   Color, Urine YELLOW (*)    APPearance CLEAR (*)    All other components within normal limits  LIPASE, BLOOD   ____________________________________________  EKG   ____________________________________________   RADIOLOGY  US Transvaginal Non-ob  Result Date: 05/10/2018 CLINICAL DATA:  Sudden left lower quadrant pain. EXAM: TRANSABDOMINAL AND TRANSVAGINAL ULTRASOUND OF PELVIS TECHNIQUE: Both transabdominal and transvaginal ultrasound examinations of the pelvis were performed. Transabdominal technique was performed for global imaging of the pelvis including uterus, ovaries, adnexal regions, and pelvic cul-de-sac. It was necessary to proceed with endovaginal exam following the transabdominal exam to visualize the adnexa. COMPARISON:  None FINDINGS: Uterus Surgically absent. Right ovary Not visualized. Left ovary Not visualized. Other findings No abnormal free fluid. IMPRESSION: The patient is status post hysterectomy. Neither ovary was visualized. No cause for pain noted. Electronically Signed   By: Dorise Bullion III M.D   On: 05/10/2018 12:19   US Pelvis Complete  Result Date: 05/10/2018 CLINICAL DATA:  Sudden left lower quadrant pain. EXAM: TRANSABDOMINAL AND TRANSVAGINAL ULTRASOUND OF PELVIS TECHNIQUE: Both transabdominal and transvaginal ultrasound  examinations of the pelvis were performed. Transabdominal technique was performed for global imaging of the pelvis including uterus, ovaries, adnexal regions, and pelvic cul-de-sac. It was necessary to proceed with endovaginal exam following the transabdominal exam to visualize the adnexa. COMPARISON:  None FINDINGS: Uterus Surgically absent. Right ovary Not visualized. Left ovary Not visualized. Other findings No abnormal free fluid. IMPRESSION: The patient is status post hysterectomy. Neither ovary was visualized. No cause for pain noted. Electronically Signed   By: Dorise Bullion III M.D   On: 05/10/2018 12:19   Ct Abdomen Pelvis W Contrast  Result Date: 05/10/2018 CLINICAL DATA:  48 year old female with history of sudden onset of lower abdominal pain (8/10 in severity). Nausea yesterday. EXAM: CT ABDOMEN AND PELVIS WITH CONTRAST TECHNIQUE: Multidetector CT  imaging of the abdomen and pelvis was performed using the standard protocol following bolus administration of intravenous contrast. CONTRAST:  142mL OMNIPAQUE IOHEXOL 300 MG/ML  SOLN COMPARISON:  CT the abdomen and pelvis 03/05/2005. FINDINGS: Lower chest: Unremarkable. Hepatobiliary: No suspicious cystic or solid hepatic lesions. No intra or extrahepatic biliary ductal dilatation. Status post cholecystectomy. Pancreas: No pancreatic mass. No pancreatic ductal dilatation. No pancreatic or peripancreatic fluid or inflammatory changes. Spleen: Small splenule adjacent to the splenic hilum. Otherwise, unremarkable. Adrenals/Urinary Tract: Bilateral kidneys and bilateral adrenal glands are normal in appearance. No hydroureteronephrosis. Urinary bladder is normal in appearance. Stomach/Bowel: Normal appearance of the stomach. No pathologic dilatation of small bowel or colon. Status post appendectomy. Vascular/Lymphatic: No atherosclerotic calcifications in the abdominal aorta or pelvic vasculature. No aneurysm or dissection in the abdominal or pelvic vasculature. No lymphadenopathy noted in the abdomen or pelvis. Reproductive: Status post hysterectomy. Right ovary is unremarkable in appearance. Left ovary is not confidently identified may be surgically absent or atrophic. Other: No significant volume of ascites.  No pneumoperitoneum. Musculoskeletal: There are no aggressive appearing lytic or blastic lesions noted in the visualized portions of the skeleton. IMPRESSION: 1. No acute findings are noted in the abdomen or pelvis to account for the patient's symptoms. 2. Status post appendectomy and cholecystectomy. Electronically Signed   By: Vinnie Langton M.D.   On: 05/10/2018 13:23    Imaging including ultrasound and CT scan reviewed, no acute findings.  Ovaries are not well visualized on ultrasound, but the right ovary is present on CT.  She does not have any right-sided pain, and certainly she could have an atrophic  left ovary. ____________________________________________   PROCEDURES  Procedure(s) performed: None  Procedures  Critical Care performed: No  ____________________________________________   INITIAL IMPRESSION / ASSESSMENT AND PLAN / ED COURSE  Pertinent labs & imaging results that were available during my care of the patient were reviewed by me and considered in my medical decision making (see chart for details).   Differential diagnosis includes but is not limited to, abdominal perforation, aortic dissection, cholecystitis, appendicitis, diverticulitis, colitis, esophagitis/gastritis, kidney stone, pyelonephritis, urinary tract infection, aortic aneurysm. All are considered in decision and treatment plan. Based upon the patient's presentation and risk factors, I am concerned about acute left lower quadrant pathology.  The differential would certainly include ovarian process.  She has had a hysterectomy, but believes both ovaries are still present.  Additionally diverticulitis, kidney stone, bladder infection, or other acute intra-abdominal process involving the left lower abdomen is considered.  The patient's CT and ultrasound are reviewed, unrevealing for acute cause but her pain is also better.  On reexam she denies pain to palpation now in the abdomen including left lower quadrant.  She appears much improved.  Question the cause of her pain if this could have been something like a gas or cramps, but present time reassuring exam.  The lack of visualization of the left ovary would highly suggest that there would be no notable concern for torsion, it is not seen either on CT or ultrasound and likely could be atrophic.  She is not febrile, she is not unstable with regard to her vital signs  ----------------------------------------- 2:28 PM on 05/10/2018 -----------------------------------------  Patient is feeling better.  The pain is improved.  She is resting comfortably and reports she  feels much better.  She is comfortable with the plan for discharge and we reviewed careful return precautions which she is in agreement with.    Return precautions and treatment recommendations and follow-up discussed with the patient who is agreeable with the plan.   ____________________________________________   FINAL CLINICAL IMPRESSION(S) / ED DIAGNOSES  Final diagnoses:  Left lower quadrant abdominal pain        Note:  This document was prepared using Dragon voice recognition software and may include unintentional dictation errors       Delman Kitten, MD 05/10/18 1430

## 2018-05-10 NOTE — Discharge Instructions (Addendum)
You were seen in the emergency room for abdominal pain. It is important that you follow up closely with your primary care doctor in the next couple of days if possible.  No driving this afternoon (you were given morphine), and no use of guns or weapons today as morphine can cause you to have fatigue or dizziness.  Please return to the emergency room right away if you are to develop a fever, severe nausea, your pain becomes severe or worsens, you are unable to keep food down, begin vomiting any dark or bloody fluid, you develop any dark or bloody stools, feel dehydrated, or other new concerns or symptoms arise.

## 2018-05-10 NOTE — ED Triage Notes (Signed)
Pt c/o sudden onset lower abdominal pain 8/10 while she was at work. Pt denies having uterus, denies having appendix, denies having gallbladder. Pt states unrelated nausea yesterday. Denies urinary symptoms.

## 2018-05-10 NOTE — ED Notes (Signed)
Pt c/o of lower abd pain that began this AM. Does not have uterus (still has ovaries), appendix or gallbladder. No bowel obstruction hx or kidney stone hx. Is holding central lower abd. Denies N&V&D. Denies urinary symptoms. A&O.

## 2018-07-09 ENCOUNTER — Other Ambulatory Visit: Payer: Self-pay | Admitting: Family Medicine

## 2018-07-09 DIAGNOSIS — Z1231 Encounter for screening mammogram for malignant neoplasm of breast: Secondary | ICD-10-CM

## 2018-07-31 ENCOUNTER — Emergency Department
Admission: EM | Admit: 2018-07-31 | Discharge: 2018-07-31 | Disposition: A | Discharge status: Home / Self Care | Payer: Managed Care, Other (non HMO) | Attending: Emergency Medicine | Admitting: Emergency Medicine

## 2018-07-31 ENCOUNTER — Other Ambulatory Visit: Payer: Self-pay

## 2018-07-31 ENCOUNTER — Encounter: Payer: Self-pay | Admitting: Emergency Medicine

## 2018-07-31 DIAGNOSIS — H579 Unspecified disorder of eye and adnexa: Secondary | ICD-10-CM | POA: Diagnosis present

## 2018-07-31 DIAGNOSIS — I10 Essential (primary) hypertension: Secondary | ICD-10-CM | POA: Insufficient documentation

## 2018-07-31 DIAGNOSIS — Z79899 Other long term (current) drug therapy: Secondary | ICD-10-CM | POA: Insufficient documentation

## 2018-07-31 DIAGNOSIS — H1131 Conjunctival hemorrhage, right eye: Secondary | ICD-10-CM | POA: Diagnosis not present

## 2018-07-31 DIAGNOSIS — J45909 Unspecified asthma, uncomplicated: Secondary | ICD-10-CM | POA: Diagnosis not present

## 2018-07-31 NOTE — ED Provider Notes (Signed)
[facility] Emergency Department Provider Note ____________________________________________  Time seen: 1915  I have reviewed the triage vital signs and the nursing notes.  HISTORY  Chief Complaint  Eye irritation  HPI Marcia Maldonado is a 48 y.o. female presents to the ED for evaluation of right eye redness that she noted today.  She reports her baseline right frontal headache, which is present.  She admits to rubbing the eye and brow region due to headache tension, over the last few days. Patient denies any nausea, vomiting, fever, chills, dizziness, vertigo, vision loss, or severe headache.  She also denies any direct trauma to the eye, allergens or irritants.  She does not wear glasses or contacts.  Past Medical History:  Diagnosis Date  . Asthma   . Hypertension   . Migraine     Patient Active Problem List   Diagnosis Date Noted  . Asthma 08/25/2013  . HTN (hypertension) 08/25/2013  . Fibroids 08/11/2013    Past Surgical History:  Procedure Laterality Date  . ABDOMINAL HYSTERECTOMY    . APPENDECTOMY    . BREAST BIOPSY Left    neg  . CHOLECYSTECTOMY    . ECTOPIC PREGNANCY SURGERY      Prior to Admission medications   Medication Sig Start Date End Date Taking? Authorizing Provider  albuterol (PROVENTIL HFA;VENTOLIN HFA) 108 (90 BASE) MCG/ACT inhaler Inhale 2 puffs into the lungs every 6 (six) hours as needed. For SOB    [provider]  amLODipine (NORVASC) 10 MG tablet Take 10 mg by mouth daily.    [provider]  baclofen (LIORESAL) 10 MG tablet Take 1 tablet (10 mg total) by mouth 3 (three) times daily. 11/04/17 11/04/18  Fisher, Linden Dolin, PA-C  hydrochlorothiazide (HYDRODIURIL) 25 MG tablet Take 25 mg by mouth daily.    [provider]  labetalol (NORMODYNE) 100 MG tablet Take 100 mg by mouth 2 (two) times daily.    [provider]  ondansetron (ZOFRAN-ODT) 4 MG disintegrating tablet Take 1 tablet (4 mg  total) by mouth every 8 (eight) hours as needed for nausea or vomiting. 01/08/18   Jacqulyn Cane, MD  promethazine-codeine [facility] WITH CODEINE) 6.25-10 MG/5ML syrup Take 1-2 teaspoons every 6 hours as needed for severe cough. May cause drowsiness. 01/08/18   Jacqulyn Cane, MD    Allergies Erythromycin, Phenylephrine-guaifenesin, Guaifenesin, and Guaifenesin & derivatives  Family History  Problem Relation Age of Onset  . Breast cancer Mother 36    Social History Social History   Tobacco Use  . Smoking status: Never Smoker  . Smokeless tobacco: Never Used  Substance Use Topics  . Alcohol use: Yes  . Drug use: No    Review of Systems  Constitutional: Negative for fever. Eyes: Negative for visual changes. Right eye redness as above ENT: Negative for sore throat. Cardiovascular: Negative for chest pain. Respiratory: Negative for shortness of breath. Gastrointestinal: Negative for abdominal pain, vomiting and diarrhea. Genitourinary: Negative for dysuria. Musculoskeletal: Negative for back pain. Skin: Negative for rash. Neurological: Negative for headaches, focal weakness or numbness. ____________________________________________  PHYSICAL EXAM:  VITAL SIGNS: ED Triage Vitals  Enc Vitals Group     BP 07/31/18 1652 (!) 176/101     Pulse Rate 07/31/18 1652 83     Resp 07/31/18 1652 16     Temp 07/31/18 1652 98.2 F (36.8 C)     Temp Source 07/31/18 1652 Oral     SpO2 07/31/18 1652 99 %  Weight 07/31/18 1653 250 lb (113.4 kg)     Height 07/31/18 1653 5\' 7"  (1.702 m)     Head Circumference --      Peak Flow --      Pain Score 07/31/18 1653 4     Pain Loc --      Pain Edu? --      Excl. in [place]? --     Constitutional: Alert and oriented. Well appearing and in no distress. Head: Normocephalic and atraumatic. Eyes: Conjunctivae are anicteric, and there is an area of hemorrhage to the conjunctiva from 12 o'clock to 1 o'clock position. PERRL. Normal extraocular  movements and normal fundi bilaterally Ears: Canals clear. TMs intact bilaterally. Nose: No congestion/rhinorrhea/epistaxis. Hematological/Lymphatic/Immunological: No preauricular lymphadenopathy. Cardiovascular: Normal rate, regular rhythm. Normal distal pulses. Respiratory: Normal respiratory effort. Musculoskeletal: Nontender with normal range of motion in all extremities.  Neurologic: CN II-XII grossly intact. Normal gait without ataxia. Normal speech and language. No gross focal neurologic deficits are appreciated. Skin:  Skin is warm, dry and intact. No rash noted. Psychiatric: Mood is normal and affect is anxious. Patient exhibits appropriate insight and judgment. ____________________________________________  PROCEDURES  Procedures   Visual Acuity  OD 20/20 uncorrected ____________________________________________  INITIAL IMPRESSION / ASSESSMENT AND PLAN / ED COURSE  Marcia Maldonado was evaluated in Emergency Department on 07/31/2018 for the symptoms described in the history of present illness. She was evaluated in the context of the global COVID-19 pandemic, which necessitated consideration that the patient might be at risk for infection with the SARS-CoV-2 virus that causes COVID-19. Institutional protocols and algorithms that pertain to the evaluation of patients at risk for COVID-19 are in a state of rapid change based on information released by regulatory bodies including the CDC and federal and state organizations. These policies and algorithms were followed during the patient's care in the ED.  Patient presents to the ED for evaluation of redness noted on the right eye today. Her exam is benign and reveals a subconjunctival hemorrhage. The patient has declined a fluorescein dye test or tetracaine drop due to her extremely sensitive eyes and anxiety about anything near her eye. She is reassured by her findings and understands the self-limited course. She will follow-up with her  PCP or eye care specialist as needed.  ____________________________________________  FINAL CLINICAL IMPRESSION(S) / ED DIAGNOSES  Final diagnoses:  Subconjunctival hemorrhage of right eye      Carmie End, Dannielle Karvonen, PA-C 07/31/18 2013    Nance Pear, MD 07/31/18 2037

## 2018-07-31 NOTE — Discharge Instructions (Signed)
You have a subconjunctival hemorrhage on the right eye. This is common and resolves without intervention. Follow-up with your primary provider or eye care specialist as needed. Return to the ED as needed.

## 2018-07-31 NOTE — ED Notes (Signed)
ED Provider at bedside. 

## 2018-07-31 NOTE — ED Triage Notes (Signed)
Pt arrives with complaints of right eye irritation that started today. Eye appears red

## 2018-08-27 ENCOUNTER — Ambulatory Visit
Admission: RE | Admit: 2018-08-27 | Discharge: 2018-08-27 | Disposition: A | Discharge status: Home / Self Care | Payer: Managed Care, Other (non HMO) | Source: Ambulatory Visit | Attending: Family Medicine | Admitting: Family Medicine

## 2018-08-27 DIAGNOSIS — Z1231 Encounter for screening mammogram for malignant neoplasm of breast: Secondary | ICD-10-CM

## 2018-12-22 DIAGNOSIS — L659 Nonscarring hair loss, unspecified: Secondary | ICD-10-CM | POA: Insufficient documentation

## 2019-01-28 ENCOUNTER — Other Ambulatory Visit: Payer: Self-pay | Admitting: Sports Medicine

## 2019-01-28 DIAGNOSIS — G8929 Other chronic pain: Secondary | ICD-10-CM

## 2019-01-28 DIAGNOSIS — M25562 Pain in left knee: Secondary | ICD-10-CM

## 2019-01-28 DIAGNOSIS — M1712 Unilateral primary osteoarthritis, left knee: Secondary | ICD-10-CM

## 2019-01-28 DIAGNOSIS — M25462 Effusion, left knee: Secondary | ICD-10-CM

## 2019-02-06 ENCOUNTER — Ambulatory Visit
Admission: RE | Admit: 2019-02-06 | Discharge: 2019-02-06 | Disposition: A | Discharge status: Home / Self Care | Payer: Managed Care, Other (non HMO) | Source: Ambulatory Visit | Attending: Sports Medicine | Admitting: Sports Medicine

## 2019-02-06 DIAGNOSIS — G8929 Other chronic pain: Secondary | ICD-10-CM | POA: Insufficient documentation

## 2019-02-06 DIAGNOSIS — M25462 Effusion, left knee: Secondary | ICD-10-CM | POA: Insufficient documentation

## 2019-02-06 DIAGNOSIS — M1712 Unilateral primary osteoarthritis, left knee: Secondary | ICD-10-CM | POA: Insufficient documentation

## 2019-02-06 DIAGNOSIS — M25562 Pain in left knee: Secondary | ICD-10-CM | POA: Insufficient documentation

## 2019-02-10 ENCOUNTER — Other Ambulatory Visit: Payer: Self-pay | Admitting: Orthopedic Surgery

## 2019-02-12 ENCOUNTER — Encounter
Admission: RE | Admit: 2019-02-12 | Discharge: 2019-02-12 | Disposition: A | Discharge status: Home / Self Care | Payer: Managed Care, Other (non HMO) | Source: Ambulatory Visit | Attending: Orthopedic Surgery | Admitting: Orthopedic Surgery

## 2019-02-12 ENCOUNTER — Other Ambulatory Visit: Payer: Self-pay

## 2019-02-12 DIAGNOSIS — Z01818 Encounter for other preprocedural examination: Secondary | ICD-10-CM | POA: Diagnosis not present

## 2019-02-12 DIAGNOSIS — Z20822 Contact with and (suspected) exposure to covid-19: Secondary | ICD-10-CM | POA: Insufficient documentation

## 2019-02-12 DIAGNOSIS — I10 Essential (primary) hypertension: Secondary | ICD-10-CM | POA: Insufficient documentation

## 2019-02-12 NOTE — Patient Instructions (Signed)
Your procedure is scheduled on: Tuesday 02/16/19.  Report to DAY SURGERY DEPARTMENT LOCATED ON 2ND FLOOR MEDICAL MALL ENTRANCE. To find out your arrival time please call [phone] between 1PM - 3PM on Monday 02/15/19.   Remember: Instructions that are not followed completely may result in serious medical risk, up to and including death, or upon the discretion of your surgeon and anesthesiologist your surgery may need to be rescheduled.      _X__ 1. Do not eat food after midnight the night before your procedure.                 No gum chewing or hard candies. You may drink clear liquids up to 2 hours                 before you are scheduled to arrive for your surgery- DO NOT drink clear                 liquids within 2 hours of the start of your surgery.                 Clear Liquids include:  water, apple juice without pulp, clear carbohydrate                 drink such as Clearfast or Gatorade, Black Coffee or Tea (Do not add                 anything to coffee or tea).    __X__2.  On the morning of surgery brush your teeth with toothpaste and water, you may rinse your mouth with mouthwash if you wish.  Do not swallow any toothpaste or mouthwash.       _X__ 3.  No Alcohol for 24 hours before or after surgery.    __X__4.  Notify your doctor if there is any change in your medical condition      (cold, fever, infections).      Do not wear jewelry, make-up, hairpins, clips or nail polish. Do not wear lotions, powders, or perfumes.  Do not shave 48 hours prior to surgery. Men may shave face and neck. Do not bring valuables to the hospital.     [facility] is not responsible for any belongings or valuables.    Contacts, dentures/partials or body piercings may not be worn into surgery. Bring a case for your contacts, glasses or hearing aids, a denture cup will be supplied.     Patients discharged the day of surgery will not be allowed to drive home.   __X__ Take these  medicines the morning of surgery with A SIP OF WATER:     1. albuterol (PROVENTIL HFA;VENTOLIN HFA) 108 (90 BASE) MCG/ACT inhaler  2. amLODipine (NORVASC) 10 MG tablet  3. labetalol (NORMODYNE) 200 MG tablet  4. topiramate (TOPAMAX) 50 MG tablet  5. SUMAtriptan (IMITREX) 25 MG tablet if needed    __X__ Use CHG Soap as directed       __X__ Stop Anti-inflammatories 7 days before surgery such as Advil, Ibuprofen, Motrin, BC or Goodies Powder, Naprosyn, Naproxen, Aleve, Aspirin, Meloxicam. May take Tylenol if needed for pain or discomfort.

## 2019-02-15 ENCOUNTER — Other Ambulatory Visit: Payer: Self-pay

## 2019-02-15 ENCOUNTER — Encounter
Admission: RE | Admit: 2019-02-15 | Discharge: 2019-02-15 | Disposition: A | Discharge status: Home / Self Care | Payer: Managed Care, Other (non HMO) | Source: Ambulatory Visit | Attending: Orthopedic Surgery | Admitting: Orthopedic Surgery

## 2019-02-15 DIAGNOSIS — Z01818 Encounter for other preprocedural examination: Secondary | ICD-10-CM | POA: Diagnosis not present

## 2019-02-15 LAB — BASIC METABOLIC PANEL
Anion gap: 9 (ref 5–15)
BUN: 13 mg/dL (ref 6–20)
CO2: 27 mmol/L (ref 22–32)
Calcium: 9.2 mg/dL (ref 8.9–10.3)
Chloride: 104 mmol/L (ref 98–111)
Creatinine, Ser: 1.15 mg/dL — ABNORMAL HIGH (ref 0.44–1.00)
GFR calc Af Amer: >60 mL/min (ref >60)
GFR calc non Af Amer: 56 mL/min — ABNORMAL LOW (ref >60)
Glucose, Bld: 94 mg/dL (ref 70–99)
Potassium: 3.3 mmol/L — ABNORMAL LOW (ref 3.5–5.1)
Sodium: 140 mmol/L (ref 135–145)

## 2019-02-15 LAB — CBC
HCT: 46 % (ref 36.0–46.0)
Hemoglobin: 15.8 g/dL — ABNORMAL HIGH (ref 12.0–15.0)
MCH: 30.8 pg (ref 26.0–34.0)
MCHC: 34.3 g/dL (ref 30.0–36.0)
MCV: 89.7 fL (ref 80.0–100.0)
Platelets: 184 10*3/uL (ref 150–400)
RBC: 5.13 MIL/uL — ABNORMAL HIGH (ref 3.87–5.11)
RDW: 12.8 % (ref 11.5–15.5)
WBC: 4.2 10*3/uL (ref 4.0–10.5)
nRBC: 0 % (ref 0.0–0.2)

## 2019-02-15 LAB — SARS CORONAVIRUS 2 (TAT 6-24 HRS): SARS Coronavirus 2: NEGATIVE

## 2019-02-15 NOTE — Pre-Procedure Instructions (Addendum)
Office notified via fax and VM of anbnormal labs.

## 2019-02-24 ENCOUNTER — Other Ambulatory Visit: Payer: Self-pay | Admitting: Orthopedic Surgery

## 2019-03-01 ENCOUNTER — Other Ambulatory Visit: Payer: Managed Care, Other (non HMO)

## 2019-03-02 ENCOUNTER — Other Ambulatory Visit: Payer: Self-pay

## 2019-03-02 ENCOUNTER — Other Ambulatory Visit
Admission: RE | Admit: 2019-03-02 | Discharge: 2019-03-02 | Disposition: A | Discharge status: Home / Self Care | Payer: Managed Care, Other (non HMO) | Source: Ambulatory Visit | Attending: Orthopedic Surgery | Admitting: Orthopedic Surgery

## 2019-03-02 DIAGNOSIS — Z01812 Encounter for preprocedural laboratory examination: Secondary | ICD-10-CM | POA: Diagnosis not present

## 2019-03-02 DIAGNOSIS — Z20822 Contact with and (suspected) exposure to covid-19: Secondary | ICD-10-CM | POA: Insufficient documentation

## 2019-03-02 LAB — SARS CORONAVIRUS 2 (TAT 6-24 HRS): SARS Coronavirus 2: NEGATIVE

## 2019-03-04 ENCOUNTER — Inpatient Hospital Stay: Payer: Managed Care, Other (non HMO) | Admitting: Anesthesiology

## 2019-03-04 ENCOUNTER — Inpatient Hospital Stay
Admission: AD | Admit: 2019-03-04 | Discharge: 2019-03-07 | DRG: 470 | Disposition: A | Discharge status: Home / Self Care | Payer: Managed Care, Other (non HMO) | Attending: Orthopedic Surgery | Admitting: Orthopedic Surgery

## 2019-03-04 ENCOUNTER — Encounter
Admission: AD | Disposition: A | Discharge status: Home / Self Care | Payer: Self-pay | Source: Home / Self Care | Attending: Orthopedic Surgery

## 2019-03-04 ENCOUNTER — Inpatient Hospital Stay: Payer: Managed Care, Other (non HMO)

## 2019-03-04 ENCOUNTER — Encounter: Payer: Self-pay | Admitting: Orthopedic Surgery

## 2019-03-04 ENCOUNTER — Other Ambulatory Visit: Payer: Self-pay

## 2019-03-04 DIAGNOSIS — Z79899 Other long term (current) drug therapy: Secondary | ICD-10-CM | POA: Diagnosis not present

## 2019-03-04 DIAGNOSIS — X58XXXD Exposure to other specified factors, subsequent encounter: Secondary | ICD-10-CM | POA: Diagnosis present

## 2019-03-04 DIAGNOSIS — S83232D Complex tear of medial meniscus, current injury, left knee, subsequent encounter: Secondary | ICD-10-CM | POA: Diagnosis not present

## 2019-03-04 DIAGNOSIS — M1712 Unilateral primary osteoarthritis, left knee: Secondary | ICD-10-CM | POA: Diagnosis present

## 2019-03-04 DIAGNOSIS — J45909 Unspecified asthma, uncomplicated: Secondary | ICD-10-CM | POA: Diagnosis present

## 2019-03-04 DIAGNOSIS — Z96659 Presence of unspecified artificial knee joint: Secondary | ICD-10-CM

## 2019-03-04 DIAGNOSIS — I10 Essential (primary) hypertension: Secondary | ICD-10-CM | POA: Diagnosis present

## 2019-03-04 DIAGNOSIS — Z8249 Family history of ischemic heart disease and other diseases of the circulatory system: Secondary | ICD-10-CM

## 2019-03-04 DIAGNOSIS — Z888 Allergy status to other drugs, medicaments and biological substances status: Secondary | ICD-10-CM | POA: Diagnosis not present

## 2019-03-04 DIAGNOSIS — Z881 Allergy status to other antibiotic agents status: Secondary | ICD-10-CM | POA: Diagnosis not present

## 2019-03-04 DIAGNOSIS — E282 Polycystic ovarian syndrome: Secondary | ICD-10-CM | POA: Diagnosis present

## 2019-03-04 DIAGNOSIS — Z87410 Personal history of cervical dysplasia: Secondary | ICD-10-CM | POA: Diagnosis not present

## 2019-03-04 DIAGNOSIS — Z96652 Presence of left artificial knee joint: Secondary | ICD-10-CM

## 2019-03-04 DIAGNOSIS — Z833 Family history of diabetes mellitus: Secondary | ICD-10-CM

## 2019-03-04 DIAGNOSIS — G8918 Other acute postprocedural pain: Secondary | ICD-10-CM

## 2019-03-04 DIAGNOSIS — N289 Disorder of kidney and ureter, unspecified: Secondary | ICD-10-CM | POA: Diagnosis present

## 2019-03-04 DIAGNOSIS — Z9049 Acquired absence of other specified parts of digestive tract: Secondary | ICD-10-CM | POA: Diagnosis not present

## 2019-03-04 HISTORY — PX: TOTAL KNEE ARTHROPLASTY: SHX125

## 2019-03-04 LAB — CBC WITH DIFFERENTIAL/PLATELET
Abs Immature Granulocytes: 0.02 10*3/uL (ref 0.00–0.07)
Basophils Absolute: 0.1 10*3/uL (ref 0.0–0.1)
Basophils Relative: 1 %
Eosinophils Absolute: 0.1 10*3/uL (ref 0.0–0.5)
Eosinophils Relative: 2 %
HCT: 43.7 % (ref 36.0–46.0)
Hemoglobin: 15.3 g/dL — ABNORMAL HIGH (ref 12.0–15.0)
Immature Granulocytes: 0 %
Lymphocytes Relative: 29 %
Lymphs Abs: 1.7 10*3/uL (ref 0.7–4.0)
MCH: 30.8 pg (ref 26.0–34.0)
MCHC: 35 g/dL (ref 30.0–36.0)
MCV: 87.9 fL (ref 80.0–100.0)
Monocytes Absolute: 0.9 10*3/uL (ref 0.1–1.0)
Monocytes Relative: 16 %
Neutro Abs: 3 10*3/uL (ref 1.7–7.7)
Neutrophils Relative %: 52 %
Platelets: 187 10*3/uL (ref 150–400)
RBC: 4.97 MIL/uL (ref 3.87–5.11)
RDW: 12.2 % (ref 11.5–15.5)
WBC: 5.7 10*3/uL (ref 4.0–10.5)
nRBC: 0 % (ref 0.0–0.2)

## 2019-03-04 LAB — URINALYSIS, ROUTINE W REFLEX MICROSCOPIC
Bilirubin Urine: NEGATIVE
Glucose, UA: NEGATIVE mg/dL
Hgb urine dipstick: NEGATIVE
Ketones, ur: NEGATIVE mg/dL
Leukocytes,Ua: NEGATIVE
Nitrite: NEGATIVE
Protein, ur: NEGATIVE mg/dL
Specific Gravity, Urine: 1.011 (ref 1.005–1.030)
pH: 6 (ref 5.0–8.0)

## 2019-03-04 LAB — PROTIME-INR
INR: 1.1 (ref 0.8–1.2)
Prothrombin Time: 13.6 seconds (ref 11.4–15.2)

## 2019-03-04 LAB — COMPREHENSIVE METABOLIC PANEL
ALT: 24 U/L (ref 0–44)
AST: 23 U/L (ref 15–41)
Albumin: 4.1 g/dL (ref 3.5–5.0)
Alkaline Phosphatase: 65 U/L (ref 38–126)
Anion gap: 8 (ref 5–15)
BUN: 10 mg/dL (ref 6–20)
CO2: 25 mmol/L (ref 22–32)
Calcium: 8.6 mg/dL — ABNORMAL LOW (ref 8.9–10.3)
Chloride: 106 mmol/L (ref 98–111)
Creatinine, Ser: 1.12 mg/dL — ABNORMAL HIGH (ref 0.44–1.00)
GFR calc Af Amer: >60 mL/min (ref >60)
GFR calc non Af Amer: 58 mL/min — ABNORMAL LOW (ref >60)
Glucose, Bld: 99 mg/dL (ref 70–99)
Potassium: 3.4 mmol/L — ABNORMAL LOW (ref 3.5–5.1)
Sodium: 139 mmol/L (ref 135–145)
Total Bilirubin: 0.7 mg/dL (ref 0.3–1.2)
Total Protein: 7.8 g/dL (ref 6.5–8.1)

## 2019-03-04 LAB — SURGICAL PCR SCREEN
MRSA, PCR: NEGATIVE
Staphylococcus aureus: NEGATIVE

## 2019-03-04 LAB — TYPE AND SCREEN
ABO/RH(D): O POS
Antibody Screen: NEGATIVE

## 2019-03-04 LAB — ABO/RH: ABO/RH(D): O POS

## 2019-03-04 LAB — APTT: aPTT: 35 seconds (ref 24–36)

## 2019-03-04 SURGERY — ARTHROPLASTY, KNEE, TOTAL
Anesthesia: Spinal | Site: Knee | Laterality: Left

## 2019-03-04 MED ORDER — DOCUSATE SODIUM 100 MG PO CAPS
100.0000 mg | ORAL_CAPSULE | Freq: Two times a day (BID) | ORAL | Status: DC
  Administered 2019-03-04 – 2019-03-06 (×5): 100 mg via ORAL
  Filled 2019-03-04 (×6): qty 1

## 2019-03-04 MED ORDER — TRANEXAMIC ACID-NACL 1000-0.7 MG/100ML-% IV SOLN
1000.0000 mg | INTRAVENOUS | Status: AC
  Administered 2019-03-04: 09:00:00 1000 mg via INTRAVENOUS

## 2019-03-04 MED ORDER — ONDANSETRON HCL 4 MG/2ML IJ SOLN: 4.0000 mg | Freq: Four times a day (QID) | INTRAMUSCULAR | Status: DC | PRN

## 2019-03-04 MED ORDER — SODIUM CHLORIDE (PF) 0.9 % IJ SOLN
INTRAMUSCULAR | Status: AC
  Filled 2019-03-04: qty 100

## 2019-03-04 MED ORDER — ACETAMINOPHEN 500 MG PO TABS
1000.0000 mg | ORAL_TABLET | Freq: Four times a day (QID) | ORAL | Status: AC
  Administered 2019-03-04 – 2019-03-05 (×3): 1000 mg via ORAL
  Filled 2019-03-04 (×4): qty 2

## 2019-03-04 MED ORDER — METHOCARBAMOL 1000 MG/10ML IJ SOLN
500.0000 mg | Freq: Four times a day (QID) | INTRAVENOUS | Status: DC | PRN
  Filled 2019-03-04: qty 5

## 2019-03-04 MED ORDER — NEOMYCIN-POLYMYXIN B GU 40-200000 IR SOLN
Status: AC
  Filled 2019-03-04: qty 20

## 2019-03-04 MED ORDER — DIPHENHYDRAMINE HCL 12.5 MG/5ML PO ELIX
12.5000 mg | ORAL_SOLUTION | ORAL | Status: DC | PRN
  Filled 2019-03-04: qty 10

## 2019-03-04 MED ORDER — ONDANSETRON HCL 4 MG/2ML IJ SOLN
4.0000 mg | Freq: Once | INTRAMUSCULAR | Status: AC | PRN
  Administered 2019-03-04: 13:00:00 4 mg via INTRAVENOUS

## 2019-03-04 MED ORDER — LACTATED RINGERS IV SOLN: INTRAVENOUS | Status: DC

## 2019-03-04 MED ORDER — CEFAZOLIN SODIUM-DEXTROSE 2-4 GM/100ML-% IV SOLN
INTRAVENOUS | Status: AC
  Filled 2019-03-04: qty 100

## 2019-03-04 MED ORDER — BUPIVACAINE HCL (PF) 0.25 % IJ SOLN
INTRAMUSCULAR | Status: AC
  Filled 2019-03-04: qty 30

## 2019-03-04 MED ORDER — HYDROMORPHONE HCL 1 MG/ML IJ SOLN
0.5000 mg | INTRAMUSCULAR | Status: DC | PRN
  Administered 2019-03-04 (×2): 1 mg via INTRAVENOUS
  Administered 2019-03-05: 06:00:00 0.5 mg via INTRAVENOUS
  Administered 2019-03-05: 01:00:00 1 mg via INTRAVENOUS
  Filled 2019-03-04 (×4): qty 1

## 2019-03-04 MED ORDER — ONDANSETRON HCL 4 MG/2ML IJ SOLN
4.0000 mg | Freq: Four times a day (QID) | INTRAMUSCULAR | Status: DC | PRN
  Administered 2019-03-04 – 2019-03-05 (×3): 4 mg via INTRAVENOUS
  Filled 2019-03-04 (×3): qty 2

## 2019-03-04 MED ORDER — TRANEXAMIC ACID-NACL 1000-0.7 MG/100ML-% IV SOLN
INTRAVENOUS | Status: AC
  Filled 2019-03-04: qty 100

## 2019-03-04 MED ORDER — SODIUM CHLORIDE 0.9 % IV SOLN
INTRAVENOUS | Status: DC | PRN
  Administered 2019-03-04: 60 mL

## 2019-03-04 MED ORDER — ONDANSETRON HCL 4 MG PO TABS: 4.0000 mg | ORAL_TABLET | Freq: Four times a day (QID) | ORAL | Status: DC | PRN

## 2019-03-04 MED ORDER — PROPOFOL 10 MG/ML IV BOLUS
INTRAVENOUS | Status: DC | PRN
  Administered 2019-03-04: 50 mg via INTRAVENOUS

## 2019-03-04 MED ORDER — CHLORHEXIDINE GLUCONATE 4 % EX LIQD
60.0000 mL | Freq: Once | CUTANEOUS | Status: AC
  Administered 2019-03-04: 4 via TOPICAL

## 2019-03-04 MED ORDER — ENOXAPARIN SODIUM 40 MG/0.4ML ~~LOC~~ SOLN
30.0000 mg | Freq: Two times a day (BID) | SUBCUTANEOUS | Status: DC
  Filled 2019-03-04 (×2): qty 0.4

## 2019-03-04 MED ORDER — FAMOTIDINE 20 MG PO TABS
20.0000 mg | ORAL_TABLET | Freq: Once | ORAL | Status: AC
  Administered 2019-03-04: 20 mg via ORAL

## 2019-03-04 MED ORDER — PROPOFOL 500 MG/50ML IV EMUL
INTRAVENOUS | Status: DC | PRN
  Administered 2019-03-04: 100 ug/kg/min via INTRAVENOUS

## 2019-03-04 MED ORDER — DOCUSATE SODIUM 100 MG PO CAPS: 100.0000 mg | ORAL_CAPSULE | Freq: Two times a day (BID) | ORAL | Status: DC

## 2019-03-04 MED ORDER — BISACODYL 10 MG RE SUPP
10.0000 mg | Freq: Every day | RECTAL | Status: DC | PRN
  Filled 2019-03-04: qty 1

## 2019-03-04 MED ORDER — SODIUM CHLORIDE 0.9 % IV SOLN: INTRAVENOUS | Status: DC

## 2019-03-04 MED ORDER — TRAMADOL HCL 50 MG PO TABS
50.0000 mg | ORAL_TABLET | Freq: Four times a day (QID) | ORAL | Status: DC
  Administered 2019-03-04 – 2019-03-06 (×6): 50 mg via ORAL
  Filled 2019-03-04 (×6): qty 1

## 2019-03-04 MED ORDER — PROPOFOL 500 MG/50ML IV EMUL
INTRAVENOUS | Status: AC
  Filled 2019-03-04: qty 50

## 2019-03-04 MED ORDER — FENTANYL CITRATE (PF) 100 MCG/2ML IJ SOLN
25.0000 ug | INTRAMUSCULAR | Status: DC | PRN
  Administered 2019-03-04 (×4): 25 ug via INTRAVENOUS

## 2019-03-04 MED ORDER — SUMATRIPTAN SUCCINATE 50 MG PO TABS
25.0000 mg | ORAL_TABLET | ORAL | Status: DC | PRN
  Filled 2019-03-04: qty 1

## 2019-03-04 MED ORDER — BUPIVACAINE-EPINEPHRINE (PF) 0.25% -1:200000 IJ SOLN
INTRAMUSCULAR | Status: DC | PRN
  Administered 2019-03-04: 30 mL

## 2019-03-04 MED ORDER — MAGNESIUM CITRATE PO SOLN
1.0000 | Freq: Once | ORAL | Status: DC | PRN
  Filled 2019-03-04: qty 296

## 2019-03-04 MED ORDER — AMLODIPINE BESYLATE 10 MG PO TABS
10.0000 mg | ORAL_TABLET | Freq: Every day | ORAL | Status: DC
  Administered 2019-03-06 – 2019-03-07 (×2): 10 mg via ORAL
  Filled 2019-03-04 (×2): qty 1

## 2019-03-04 MED ORDER — HYDROCHLOROTHIAZIDE 25 MG PO TABS
12.5000 mg | ORAL_TABLET | Freq: Every day | ORAL | Status: DC
  Administered 2019-03-04 – 2019-03-07 (×3): 12.5 mg via ORAL
  Filled 2019-03-04 (×3): qty 1

## 2019-03-04 MED ORDER — MEPERIDINE HCL 50 MG/ML IJ SOLN
INTRAMUSCULAR | Status: AC
  Filled 2019-03-04: qty 1

## 2019-03-04 MED ORDER — MIDAZOLAM HCL 5 MG/5ML IJ SOLN
INTRAMUSCULAR | Status: DC | PRN
  Administered 2019-03-04: 2 mg via INTRAVENOUS

## 2019-03-04 MED ORDER — LABETALOL HCL 200 MG PO TABS
200.0000 mg | ORAL_TABLET | Freq: Two times a day (BID) | ORAL | Status: DC
  Administered 2019-03-04 – 2019-03-07 (×5): 200 mg via ORAL
  Filled 2019-03-04 (×8): qty 1

## 2019-03-04 MED ORDER — MENTHOL 3 MG MT LOZG
1.0000 | LOZENGE | OROMUCOSAL | Status: DC | PRN
  Filled 2019-03-04: qty 9

## 2019-03-04 MED ORDER — CEFAZOLIN SODIUM-DEXTROSE 2-4 GM/100ML-% IV SOLN
2.0000 g | INTRAVENOUS | Status: AC
  Administered 2019-03-04: 09:00:00 2 g via INTRAVENOUS

## 2019-03-04 MED ORDER — ALBUTEROL SULFATE (2.5 MG/3ML) 0.083% IN NEBU
3.0000 mL | INHALATION_SOLUTION | Freq: Four times a day (QID) | RESPIRATORY_TRACT | Status: DC | PRN
  Filled 2019-03-04: qty 3

## 2019-03-04 MED ORDER — NEOMYCIN-POLYMYXIN B GU 40-200000 IR SOLN
Status: DC | PRN
  Administered 2019-03-04: 16 mL

## 2019-03-04 MED ORDER — OXYCODONE HCL 5 MG PO TABS: 5.0000 mg | ORAL_TABLET | ORAL | Status: DC | PRN

## 2019-03-04 MED ORDER — MIDAZOLAM HCL 2 MG/2ML IJ SOLN
INTRAMUSCULAR | Status: AC
  Filled 2019-03-04: qty 2

## 2019-03-04 MED ORDER — EPINEPHRINE PF 1 MG/ML IJ SOLN
INTRAMUSCULAR | Status: AC
  Filled 2019-03-04: qty 1

## 2019-03-04 MED ORDER — BUPIVACAINE HCL (PF) 0.5 % IJ SOLN
INTRAMUSCULAR | Status: DC | PRN
  Administered 2019-03-04: 3 mL

## 2019-03-04 MED ORDER — SODIUM CHLORIDE 0.9 % IV SOLN
INTRAVENOUS | Status: DC | PRN
  Administered 2019-03-04: 10:00:00 25 ug/min via INTRAVENOUS

## 2019-03-04 MED ORDER — PENTAFLUOROPROP-TETRAFLUOROETH EX AERO: INHALATION_SPRAY | CUTANEOUS | Status: AC | PRN

## 2019-03-04 MED ORDER — FENTANYL CITRATE (PF) 100 MCG/2ML IJ SOLN
INTRAMUSCULAR | Status: AC
  Filled 2019-03-04: qty 2

## 2019-03-04 MED ORDER — MORPHINE SULFATE 10 MG/ML IJ SOLN
INTRAMUSCULAR | Status: DC | PRN
  Administered 2019-03-04: 10 mg

## 2019-03-04 MED ORDER — ALUM & MAG HYDROXIDE-SIMETH 200-200-20 MG/5ML PO SUSP: 30.0000 mL | ORAL | Status: DC | PRN

## 2019-03-04 MED ORDER — METOCLOPRAMIDE HCL 10 MG PO TABS: 5.0000 mg | ORAL_TABLET | Freq: Three times a day (TID) | ORAL | Status: DC | PRN

## 2019-03-04 MED ORDER — FAMOTIDINE 20 MG PO TABS
ORAL_TABLET | ORAL | Status: AC
  Filled 2019-03-04: qty 1

## 2019-03-04 MED ORDER — ZOLPIDEM TARTRATE 5 MG PO TABS
5.0000 mg | ORAL_TABLET | Freq: Every evening | ORAL | Status: DC | PRN
  Filled 2019-03-04: qty 1

## 2019-03-04 MED ORDER — MAGNESIUM HYDROXIDE 400 MG/5ML PO SUSP
30.0000 mL | Freq: Every day | ORAL | Status: DC | PRN
  Filled 2019-03-04: qty 30

## 2019-03-04 MED ORDER — MORPHINE SULFATE (PF) 10 MG/ML IV SOLN
INTRAVENOUS | Status: AC
  Filled 2019-03-04: qty 1

## 2019-03-04 MED ORDER — METOCLOPRAMIDE HCL 5 MG/ML IJ SOLN
5.0000 mg | Freq: Three times a day (TID) | INTRAMUSCULAR | Status: DC | PRN
  Administered 2019-03-04: 16:00:00 10 mg via INTRAVENOUS
  Filled 2019-03-04: qty 2

## 2019-03-04 MED ORDER — BUPIVACAINE LIPOSOME 1.3 % IJ SUSP
INTRAMUSCULAR | Status: AC
  Filled 2019-03-04: qty 20

## 2019-03-04 MED ORDER — METHOCARBAMOL 500 MG PO TABS
500.0000 mg | ORAL_TABLET | Freq: Four times a day (QID) | ORAL | Status: DC | PRN
  Administered 2019-03-04 – 2019-03-07 (×4): 500 mg via ORAL
  Filled 2019-03-04 (×4): qty 1

## 2019-03-04 MED ORDER — MEPERIDINE HCL 50 MG/ML IJ SOLN
12.5000 mg | Freq: Once | INTRAMUSCULAR | Status: AC
  Administered 2019-03-04: 11:00:00 12.5 mg via INTRAVENOUS

## 2019-03-04 MED ORDER — ONDANSETRON HCL 4 MG/2ML IJ SOLN
INTRAMUSCULAR | Status: AC
  Filled 2019-03-04: qty 2

## 2019-03-04 MED ORDER — PHENOL 1.4 % MT LIQD
1.0000 | OROMUCOSAL | Status: DC | PRN
  Filled 2019-03-04: qty 177

## 2019-03-04 MED ORDER — OXYCODONE HCL 5 MG PO TABS: 10.0000 mg | ORAL_TABLET | ORAL | Status: DC | PRN

## 2019-03-04 MED ORDER — CEFAZOLIN SODIUM-DEXTROSE 2-4 GM/100ML-% IV SOLN
2.0000 g | Freq: Four times a day (QID) | INTRAVENOUS | Status: AC
  Administered 2019-03-04 – 2019-03-05 (×3): 2 g via INTRAVENOUS
  Filled 2019-03-04 (×4): qty 100

## 2019-03-04 MED ORDER — ACETAMINOPHEN 325 MG PO TABS: 325.0000 mg | ORAL_TABLET | Freq: Four times a day (QID) | ORAL | Status: DC | PRN

## 2019-03-04 MED ORDER — PENTAFLUOROPROP-TETRAFLUOROETH EX AERO
INHALATION_SPRAY | CUTANEOUS | Status: AC
  Filled 2019-03-04: qty 116

## 2019-03-04 MED ORDER — TOPIRAMATE 25 MG PO TABS
50.0000 mg | ORAL_TABLET | Freq: Every day | ORAL | Status: DC
  Administered 2019-03-04 – 2019-03-07 (×4): 50 mg via ORAL
  Filled 2019-03-04 (×4): qty 2

## 2019-03-04 SURGICAL SUPPLY — 69 items
BLADE SAGITTAL 25.0X1.19X90 (BLADE) ×2 IMPLANT
BLADE SAGITTAL 25.0X1.19X90MM (BLADE) ×1
BNDG ELASTIC 6X5.8 VLCR STR LF (GAUZE/BANDAGES/DRESSINGS) ×3 IMPLANT
CANISTER SUCT 1200ML W/VALVE (MISCELLANEOUS) ×3 IMPLANT
CANISTER SUCT 3000ML PPV (MISCELLANEOUS) ×6 IMPLANT
CANISTER WOUND CARE 500ML ATS (WOUND CARE) ×3 IMPLANT
CEMENT HV SMART SET (Cement) ×6 IMPLANT
CHLORAPREP W/TINT 26 (MISCELLANEOUS) ×6 IMPLANT
COOLER POLAR GLACIER W/PUMP (MISCELLANEOUS) ×3 IMPLANT
COVER WAND RF STERILE (DRAPES) ×3 IMPLANT
CUFF TOURN SGL QUICK 24 (TOURNIQUET CUFF)
CUFF TOURN SGL QUICK 30 (TOURNIQUET CUFF)
CUFF TOURN SGL QUICK 34 (TOURNIQUET CUFF) ×2
CUFF TRNQT CYL 24X4X16.5-23 (TOURNIQUET CUFF) IMPLANT
CUFF TRNQT CYL 30X4X21-28X (TOURNIQUET CUFF) IMPLANT
CUFF TRNQT CYL 34X4.125X (TOURNIQUET CUFF) ×1 IMPLANT
DRAPE 3/4 80X56 (DRAPES) ×6 IMPLANT
ELECT CAUTERY BLADE 6.4 (BLADE) ×3 IMPLANT
ELECT REM PT RETURN 9FT ADLT (ELECTROSURGICAL) ×3
ELECTRODE REM PT RTRN 9FT ADLT (ELECTROSURGICAL) ×1 IMPLANT
FEMORAL COMP SZ4 LEFT SPHERE (Femur) ×3 IMPLANT
GAUZE SPONGE 4X4 12PLY STRL (GAUZE/BANDAGES/DRESSINGS) ×3 IMPLANT
GAUZE XEROFORM 1X8 LF (GAUZE/BANDAGES/DRESSINGS) ×3 IMPLANT
GLOVE BIOGEL PI IND STRL 9 (GLOVE) ×1 IMPLANT
GLOVE BIOGEL PI INDICATOR 9 (GLOVE) ×2
GLOVE INDICATOR 8.0 STRL GRN (GLOVE) ×3 IMPLANT
GLOVE SURG ORTHO 8.0 STRL STRW (GLOVE) ×3 IMPLANT
GLOVE SURG SYN 9.0  PF PI (GLOVE) ×2
GLOVE SURG SYN 9.0 PF PI (GLOVE) ×1 IMPLANT
GOWN SRG 2XL LVL 4 RGLN SLV (GOWNS) ×1 IMPLANT
GOWN STRL NON-REIN 2XL LVL4 (GOWNS) ×2
GOWN STRL REUS W/ TWL LRG LVL3 (GOWN DISPOSABLE) ×1 IMPLANT
GOWN STRL REUS W/ TWL XL LVL3 (GOWN DISPOSABLE) ×1 IMPLANT
GOWN STRL REUS W/TWL LRG LVL3 (GOWN DISPOSABLE) ×2
GOWN STRL REUS W/TWL XL LVL3 (GOWN DISPOSABLE) ×2
HOLDER FOLEY CATH W/STRAP (MISCELLANEOUS) ×3 IMPLANT
HOOD PEEL AWAY FLYTE STAYCOOL (MISCELLANEOUS) ×6 IMPLANT
KIT PREVENA INCISION MGT20CM45 (CANNISTER) ×3 IMPLANT
KIT TURNOVER KIT A (KITS) ×3 IMPLANT
NDL SAFETY ECLIPSE 18X1.5 (NEEDLE) ×1 IMPLANT
NEEDLE HYPO 18GX1.5 SHARP (NEEDLE) ×2
NEEDLE SPNL 18GX3.5 QUINCKE PK (NEEDLE) ×3 IMPLANT
NEEDLE SPNL 20GX3.5 QUINCKE YW (NEEDLE) ×3 IMPLANT
NS IRRIG 1000ML POUR BTL (IV SOLUTION) ×3 IMPLANT
PACK TOTAL KNEE (MISCELLANEOUS) ×3 IMPLANT
PAD WRAPON POLAR KNEE (MISCELLANEOUS) ×1 IMPLANT
PATELLA RESURFACING MEDACTA SZ (Bone Implant) ×3 IMPLANT
PENCIL SMOKE EVACUATOR COATED (MISCELLANEOUS) ×3 IMPLANT
PULSAVAC PLUS IRRIG FAN TIP (DISPOSABLE) ×3
SCALPEL PROTECTED #10 DISP (BLADE) ×6 IMPLANT
SOL .9 NS 3000ML IRR  AL (IV SOLUTION) ×2
SOL .9 NS 3000ML IRR UROMATIC (IV SOLUTION) ×1 IMPLANT
STAPLER SKIN PROX 35W (STAPLE) ×3 IMPLANT
STEM EXTENSION 11MMX30MM (Stem) ×3 IMPLANT
SUCTION FRAZIER HANDLE 10FR (MISCELLANEOUS) ×2
SUCTION TUBE FRAZIER 10FR DISP (MISCELLANEOUS) ×1 IMPLANT
SUT DVC 2 QUILL PDO  T11 36X36 (SUTURE) ×2
SUT DVC 2 QUILL PDO T11 36X36 (SUTURE) ×1 IMPLANT
SUT ETHIBOND 2 V 37 (SUTURE) IMPLANT
SUT V-LOC 90 ABS DVC 3-0 CL (SUTURE) ×3 IMPLANT
SYR 20ML LL LF (SYRINGE) ×3 IMPLANT
SYR 50ML LL SCALE MARK (SYRINGE) ×6 IMPLANT
TIBIAL INSERT SZ4 LT 02120410F (Insert) ×3 IMPLANT
TIBIAL TRAY FIXED MEDACTA 0207 (Bone Implant) ×3 IMPLANT
TIP FAN IRRIG PULSAVAC PLUS (DISPOSABLE) ×1 IMPLANT
TOWEL OR 17X26 4PK STRL BLUE (TOWEL DISPOSABLE) ×3 IMPLANT
TOWER CARTRIDGE SMART MIX (DISPOSABLE) ×3 IMPLANT
TRAY FOLEY MTR SLVR 16FR STAT (SET/KITS/TRAYS/PACK) ×3 IMPLANT
WRAPON POLAR PAD KNEE (MISCELLANEOUS) ×3

## 2019-03-04 NOTE — Op Note (Signed)
03/04/2019  10:57 AM  PATIENT:  Marcia Maldonado  49 y.o. female  PRE-OPERATIVE DIAGNOSIS:  PRIMARY OSTEOARTHRITIS OF LEFT KNEE  POST-OPERATIVE DIAGNOSIS:  PRIMARY OSTEOARTHRITIS OF LEFT KNEE  PROCEDURE:  Procedure(s): LEFT TOTAL KNEE ARTHROPLASTY (Left)  SURGEON: Laurene Footman, MD  ASSISTANTS: Rachelle Hora, PA-C  ANESTHESIA:   spinal  EBL:  Total I/O In: 1000 [I.V.:1000] Out: 550 [Urine:500; Blood:50]  BLOOD ADMINISTERED:none  DRAINS: Incisional wound VAC   LOCAL MEDICATIONS USED:  MARCAINE    and OTHER Exparel morphine and Toradol  SPECIMEN:  No Specimen  DISPOSITION OF SPECIMEN:  N/A  COUNTS:  YES  TOURNIQUET:   Total Tourniquet Time Documented: Thigh (Left) - 66 minutes Total: Thigh (Left) - 66 minutes   IMPLANTS: Medacta GM K sphere system left 4 femur, 4 tibia with short stem, 10 mm insert and 3 patella, all components cemented  DICTATION: .Dragon Dictation patient was brought to the operating room and after adequate spinal anesthesia was obtained the left leg was prepped and draped in the usual sterile fashion.  After patient identification and timeout procedures were completed tourniquet was raised and a midline skin incision was made.  Parapatellar arthrotomy was performed and there is moderate synovitis present with significant wear predominately to the patellofemoral joint medial lateral compartments less so.  The fat pad and ACL PCL were incised and removed.  The proximal tibia extra medullary alignment guide was placed in a proximal tibia cut carried out.  Anterior horns of the menisci removed as well and then the intramedullary guide was placed for the femur and a 5 degree distal femoral cut was carried out followed by measuring and is made measuring the femur to a size 4.  The 4 cutting block was applied anterior posterior and chamfer cuts carried out.  The posterior horns of the menisci were excised at this time of the tibia sized to a size 4 tibial baseplate  this was pinned into position and proximal preparation.  With a 4 femur placed a 10 mm insert gave good stability and full extension.  The distal femoral drill holes were made and the pain trochlear groove cut carried out.  Trials were all removed and the above injection was carried out through periarticular tissues.  Next the patella was cut using the patellar cutting guide measured to a size 3 after adequate resection.  3 trial fit well.  At this point the bony surfaces were thoroughly irrigated and dried.  The tibia was cemented into place first with excess cement removed followed by placement of the polyethylene component and set screw with a torque screwdriver.  The femoral component was impacted in position again with excess cement removed.  The knee was held in extension as the patellar button was cemented into place.  After the cement was hardened excess cement was removed and the knee was irrigated with pulsatile lavage.  Tourniquet was let down and hemostasis achieved electrocautery.  The arthrotomy was repaired using a heavy Quill suture with a 3-0 V-Loc subcutaneous closure and skin staples.  Incisional wound VAC applied on a Polar Care  PLAN OF CARE: Admit to inpatient   PATIENT DISPOSITION:  PACU - hemodynamically stable.

## 2019-03-04 NOTE — Transfer of Care (Signed)
Immediate Anesthesia Transfer of Care Note  Patient: Marcia Maldonado  Procedure(s) Performed: LEFT TOTAL KNEE ARTHROPLASTY (Left Knee)  Patient Location: PACU  Anesthesia Type:Spinal  Level of Consciousness: sedated  Airway & Oxygen Therapy: Patient Spontanous Breathing and Patient connected to face mask oxygen  Post-op Assessment: Report given to RN and Post -op Vital signs reviewed and stable  Post vital signs: Reviewed and stable  Last Vitals:  Vitals Value Taken Time  BP    Temp    Pulse    Resp    SpO2      Last Pain:  Vitals:   03/04/19 0654  TempSrc: Temporal  PainSc: 5          Complications: No apparent anesthesia complications

## 2019-03-04 NOTE — Anesthesia Procedure Notes (Signed)
Spinal  Patient location during procedure: OR Start time: 03/04/2019 9:00 AM End time: 03/04/2019 9:05 AM Staffing Performed: resident/CRNA  Resident/CRNA: Nelda Marseille, CRNA Preanesthetic Checklist Completed: patient identified, IV checked, site marked, risks and benefits discussed, surgical consent, monitors and equipment checked, pre-op evaluation and timeout performed Spinal Block Patient position: sitting Prep: Betadine Patient monitoring: heart rate, continuous pulse ox, blood pressure and cardiac monitor Approach: midline Location: L4-5 Injection technique: single-shot Needle Needle type: Whitacre and Introducer  Needle gauge: 25 G Needle length: 9 cm Assessment Sensory level: T10 Additional Notes Negative paresthesia. Negative blood return. Positive free-flowing CSF. Expiration date of kit checked and confirmed. Patient tolerated procedure well, without complications.

## 2019-03-04 NOTE — OR Nursing (Addendum)
Dr Rudene Christians and anesthesia reviewed labs from this am. Incentive spirometer given, explained with return demonstration.

## 2019-03-04 NOTE — Progress Notes (Signed)
Assumed care of pt. Report received from [place], [place].

## 2019-03-04 NOTE — Anesthesia Preprocedure Evaluation (Signed)
Anesthesia Evaluation  Patient identified by MRN, date of birth, ID band Patient awake    Reviewed: Allergy & Precautions, NPO status , Patient's Chart, lab work & pertinent test results  History of Anesthesia Complications Negative for: history of anesthetic complications  Airway Mallampati: III       Dental   Pulmonary asthma , neg sleep apnea, neg COPD, Not current smoker,           Cardiovascular hypertension, Pt. on medications (-) Past MI and (-) CHF (-) dysrhythmias (-) Valvular Problems/Murmurs     Neuro/Psych neg Seizures    GI/Hepatic Neg liver ROS, neg GERD  ,  Endo/Other  neg diabetes  Renal/GU negative Renal ROS     Musculoskeletal   Abdominal   Peds  Hematology   Anesthesia Other Findings   Reproductive/Obstetrics                             Anesthesia Physical Anesthesia Plan  ASA: III  Anesthesia Plan: Spinal   Post-op Pain Management:    Induction:   PONV Risk Score and Plan:   Airway Management Planned:   Additional Equipment:   Intra-op Plan:   Post-operative Plan:   Informed Consent: I have reviewed the patients History and Physical, chart, labs and discussed the procedure including the risks, benefits and alternatives for the proposed anesthesia with the patient or authorized representative who has indicated his/her understanding and acceptance.       Plan Discussed with:   Anesthesia Plan Comments:         Anesthesia Quick Evaluation

## 2019-03-04 NOTE — H&P (Signed)
Reviewed paper H+P, will be scanned into chart. No changes noted.  

## 2019-03-04 NOTE — Evaluation (Signed)
Physical Therapy Evaluation Patient Details Name: Marcia Maldonado MRN: [id number] DOB: 1970/02/13 Today's Date: 03/04/2019   History of Present Illness  Pt is 50 yo female s/p L TKA, WBAT. PMH of asthma, HTN.  Clinical Impression  Pt mostly alert, oriented, reported 8/10 pain but really wanted to participate with PT. At baseline pt ambulated with [facility], independent, works full time. RN in room and administered pain medication and nausea medication prior to mobilization.  The patient was able to perform supine exercises, physical assist needed for heel slides and SLR. Supine to sit with minA for LLE management and use of bed rails. Fair sitting balance noted. Sit <> stand with RW and CGA, cues for hand placement and proper RW use. Predominantly utilized RLE for weight bearing. The patient was able to take a few steps (step to gait pattern, antalgic) towards recliner in room. Once up in chair, CNA entered room to check vitals, and patient began vomiting. RN notified and in room to assess pt at end of session.  Overall the patient demonstrated deficits (see "PT Problem List") that impede the patient's functional abilities, safety, and mobility and would benefit from skilled PT intervention. Recommendation is HHPT supervision for mobility/OOB pending pt progress.    Follow Up Recommendations Home health PT;Supervision for mobility/OOB    Equipment Recommendations  Rolling walker with 5" wheels    Recommendations for Other Services       Precautions / Restrictions Precautions Precautions: Knee Precaution Booklet Issued: Yes (comment) Restrictions Weight Bearing Restrictions: Yes LLE Weight Bearing: Weight bearing as tolerated      Mobility  Bed Mobility Overal bed mobility: Needs Assistance Bed Mobility: Supine to Sit     Supine to sit: Min assist;HOB elevated     General bed mobility comments: for LLE management  Transfers Overall transfer level: Needs assistance Equipment used:  Rolling walker (2 wheeled) Transfers: Sit to/from Stand Sit to Stand: Min guard         General transfer comment: elevated bed surface, predominantly used RLE  Ambulation/Gait   Gait Distance (Feet): 2 Feet Assistive device: Rolling walker (2 wheeled) Gait Pattern/deviations: Step-to pattern;Antalgic        Stairs            Wheelchair Mobility    Modified Rankin (Stroke Patients Only)       Balance Overall balance assessment: Needs assistance Sitting-balance support: Feet supported Sitting balance-Leahy Scale: Fair       Standing balance-Leahy Scale: Poor                               Pertinent Vitals/Pain Pain Assessment: 0-10 Pain Score: 8  Pain Location: L knee Pain Descriptors / Indicators: Aching;Grimacing;Guarding Pain Intervention(s): Limited activity within patient's tolerance;Monitored during session;Premedicated before session;Repositioned;Ice applied    Home Living Family/patient expects to be discharged to:: Private residence Living Arrangements: Spouse/significant other;Children;Parent Available Help at Discharge: Family;Available 24 hours/day Type of Home: House Home Access: Stairs to enter Entrance Stairs-Rails: Right;Left;Can reach both Entrance Stairs-Number of Steps: 4 Home Layout: One level Home Equipment: Walker - 2 wheels;Cane - single point      Prior Function Level of Independence: Independent with assistive device(s)         Comments: 2 months ago began to use SPC due to increased pain. works full time, 12 hr shifts     Hand Dominance   Dominant Hand: Right    Extremity/Trunk Assessment  Upper Extremity Assessment Upper Extremity Assessment: Overall WFL for tasks assessed    Lower Extremity Assessment Lower Extremity Assessment: RLE deficits/detail;LLE deficits/detail RLE Deficits / Details: WFLs LLE Deficits / Details: unable to perform SLR independently    Cervical / Trunk  Assessment Cervical / Trunk Assessment: Normal  Communication   Communication: No difficulties  Cognition Arousal/Alertness: Awake/alert Behavior During Therapy: WFL for tasks assessed/performed Overall Cognitive Status: Within Functional Limits for tasks assessed                                        General Comments      Exercises Total Joint Exercises Ankle Circles/Pumps: AROM;Strengthening;Both;10 reps Quad Sets: AROM;Strengthening;Left;10 reps Heel Slides: AAROM;Strengthening;Left;10 reps Hip ABduction/ADduction: AAROM;Strengthening;Left;10 reps Straight Leg Raises: AAROM;Strengthening;Left;5 reps Goniometric ROM: -10 to 70 deg   Assessment/Plan    PT Assessment Patient needs continued PT services  PT Problem List Decreased strength;Decreased safety awareness;Decreased mobility;Decreased range of motion;Decreased activity tolerance;Decreased balance;Pain;Decreased knowledge of use of DME       PT Treatment Interventions DME instruction;Therapeutic exercise;Gait training;Balance training;Stair training;Neuromuscular re-education;Functional mobility training;Patient/family education;Therapeutic activities    PT Goals (Current goals can be found in the Care Plan section)  Acute Rehab PT Goals Patient Stated Goal: to go home PT Goal Formulation: With patient Time For Goal Achievement: 03/18/19 Potential to Achieve Goals: Good    Frequency BID   Barriers to discharge        Co-evaluation               AM-PAC PT "6 Clicks" Mobility  Outcome Measure Help needed turning from your back to your side while in a flat bed without using bedrails?: A Little Help needed moving from lying on your back to sitting on the side of a flat bed without using bedrails?: A Little Help needed moving to and from a bed to a chair (including a wheelchair)?: A Little Help needed standing up from a chair using your arms (e.g., wheelchair or bedside chair)?: A  Little Help needed to walk in hospital room?: A Little Help needed climbing 3-5 steps with a railing? : A Lot 6 Click Score: 17    End of Session Equipment Utilized During Treatment: Gait belt Activity Tolerance: Other (comment)(Pt vomited after mobility) Patient left: with chair alarm set;in chair;with call bell/phone within reach;with nursing/sitter in room;with family/visitor present Nurse Communication: Mobility status PT Visit Diagnosis: Other abnormalities of gait and mobility (R26.89);Difficulty in walking, not elsewhere classified (R26.2);Muscle weakness (generalized) (M62.81);Pain Pain - Right/Left: Left Pain - part of body: Knee    Time: [id number] PT Time Calculation (min) (ACUTE ONLY): 38 min   Charges:   PT Evaluation $PT Eval Low Complexity: 1 Low PT Treatments $Therapeutic Exercise: 23-37 mins        Lieutenant Diego PT, DPT 4:23 PM,03/04/19

## 2019-03-04 NOTE — Anesthesia Procedure Notes (Signed)
Date/Time: 03/04/2019 9:06 AM Performed by: Nelda Marseille, CRNA Pre-anesthesia Checklist: Patient identified, Emergency Drugs available, Suction available, Patient being monitored and Timeout performed Oxygen Delivery Method: Simple face mask

## 2019-03-05 LAB — CBC
HCT: 39.1 % (ref 36.0–46.0)
Hemoglobin: 13 g/dL (ref 12.0–15.0)
MCH: 30.2 pg (ref 26.0–34.0)
MCHC: 33.2 g/dL (ref 30.0–36.0)
MCV: 90.9 fL (ref 80.0–100.0)
Platelets: 159 10*3/uL (ref 150–400)
RBC: 4.3 MIL/uL (ref 3.87–5.11)
RDW: 12.6 % (ref 11.5–15.5)
WBC: 8.5 10*3/uL (ref 4.0–10.5)
nRBC: 0 % (ref 0.0–0.2)

## 2019-03-05 LAB — BASIC METABOLIC PANEL
Anion gap: 8 (ref 5–15)
BUN: 12 mg/dL (ref 6–20)
CO2: 24 mmol/L (ref 22–32)
Calcium: 8.1 mg/dL — ABNORMAL LOW (ref 8.9–10.3)
Chloride: 104 mmol/L (ref 98–111)
Creatinine, Ser: 1.3 mg/dL — ABNORMAL HIGH (ref 0.44–1.00)
GFR calc Af Amer: 56 mL/min — ABNORMAL LOW (ref >60)
GFR calc non Af Amer: 48 mL/min — ABNORMAL LOW (ref >60)
Glucose, Bld: 105 mg/dL — ABNORMAL HIGH (ref 70–99)
Potassium: 3.7 mmol/L (ref 3.5–5.1)
Sodium: 136 mmol/L (ref 135–145)

## 2019-03-05 LAB — URINE CULTURE

## 2019-03-05 MED ORDER — ONDANSETRON HCL 4 MG PO TABS: 4.0000 mg | ORAL_TABLET | Freq: Four times a day (QID) | ORAL | 0 refills | Status: DC | PRN

## 2019-03-05 MED ORDER — HYDROCODONE-ACETAMINOPHEN 7.5-325 MG PO TABS: 1.0000 | ORAL_TABLET | Freq: Four times a day (QID) | ORAL | 0 refills | Status: AC | PRN

## 2019-03-05 MED ORDER — ENOXAPARIN SODIUM 30 MG/0.3ML ~~LOC~~ SOLN
30.0000 mg | Freq: Two times a day (BID) | SUBCUTANEOUS | Status: DC
  Administered 2019-03-05 – 2019-03-07 (×5): 30 mg via SUBCUTANEOUS
  Filled 2019-03-05 (×6): qty 0.3

## 2019-03-05 MED ORDER — HYDROCODONE-ACETAMINOPHEN 7.5-325 MG PO TABS
1.0000 | ORAL_TABLET | Freq: Four times a day (QID) | ORAL | Status: DC | PRN
  Administered 2019-03-05: 21:00:00 2 via ORAL
  Administered 2019-03-05: 11:00:00 1 via ORAL
  Administered 2019-03-05 – 2019-03-07 (×4): 2 via ORAL
  Filled 2019-03-05 (×2): qty 2
  Filled 2019-03-05: qty 1
  Filled 2019-03-05 (×3): qty 2

## 2019-03-05 MED ORDER — ENOXAPARIN SODIUM 40 MG/0.4ML ~~LOC~~ SOLN: 40.0000 mg | SUBCUTANEOUS | 0 refills | Status: DC

## 2019-03-05 NOTE — Evaluation (Signed)
Occupational Therapy Evaluation Patient Details Name: Marcia Maldonado MRN: [id number] DOB: January 29, 1970 Today's Date: 03/05/2019    History of Present Illness Pt is 49 yo female s/p L TKA, WBAT. PMH of asthma, HTN.   Clinical Impression   Patient seen this date for occupational therapy evaluation.  Patient returning to recliner from restroom with staff and therapist assisted.  Performed functional mobility using 2WW and CGA for safety.  Requires extra time due to post-surgical pain.  Stand<>sit with CGA and cues for hand placement.  Provided education to patient regarding OT goals and benefits.  Patient agreeable.  Completed education concerning adaptive equipment (reacher, shoe horn, sock aide, TTB) and compensatory techniques to perform dressing and bathing when pain is high.  Patient verbalized understanding.  Patient would benefit from further treatment targeting AE training, ADL retraining, activity tolerance, functional transfers and general safety.  Based on today's performance, recommending [facility] OT at discharge.       Follow Up Recommendations  Home health OT    Equipment Recommendations  Tub/shower bench    Recommendations for Other Services       Precautions / Restrictions Precautions Precautions: Knee Precaution Booklet Issued: Yes (comment) Precaution Comments: Provided hand out of adaptive equipment Restrictions Weight Bearing Restrictions: Yes LLE Weight Bearing: Weight bearing as tolerated      Mobility Bed Mobility     Transfers Overall transfer level: Needs assistance Equipment used: Rolling walker (2 wheeled) Transfers: Sit to/from Stand;Stand Pivot Transfers Sit to Stand: Min guard Stand pivot transfers: Min guard       General transfer comment: Requires cues for sequencing and hand placement    Balance                              ADL either performed or assessed with clinical judgement   ADL Overall ADL's : Needs assistance/impaired                      Lower Body Dressing: Maximal assistance;Cueing for compensatory techniques;Sit to/from stand Lower Body Dressing Details (indicate cue type and reason): Difficulty moving L LE Toilet Transfer: Min Psychiatric nurse Details (indicate cue type and reason): CGa with extra time using FWW due to pain Toileting- Clothing Manipulation and Hygiene: Minimal assistance Toileting - Clothing Manipulation Details (indicate cue type and reason): cues to alternate hands for UE support with grab bar and/or FWW     Functional mobility during ADLs: Min guard General ADL Comments: Requires extra time due to pain and stiffness associated with surgery.  Would benefit from further compensatory technique training.     Vision Baseline Vision/History: No visual deficits       Perception     Praxis      Pertinent Vitals/Pain Pain Assessment: 0-10 Pain Score: 8  Pain Location: L knee after functional transfer Pain Descriptors / Indicators: Aching;Grimacing;Guarding Pain Intervention(s): Monitored during session;Limited activity within patient's tolerance;Repositioned;Ice applied     Hand Dominance Right   Extremity/Trunk Assessment Upper Extremity Assessment Upper Extremity Assessment: Overall WFL for tasks assessed   Lower Extremity Assessment Lower Extremity Assessment: Defer to PT evaluation       Communication Communication Communication: No difficulties   Cognition Arousal/Alertness: Awake/alert Behavior During Therapy: WFL for tasks assessed/performed Overall Cognitive Status: Within Functional Limits for tasks assessed  General Comments       Exercises    Shoulder Instructions      Home Living Family/patient expects to be discharged to:: Private residence Living Arrangements: Spouse/significant other;Children;Parent Available Help at Discharge: Family;Available 24 hours/day Type of Home:  House Home Access: Stairs to enter [employer] of Steps: 4 Entrance Stairs-Rails: Right;Left;Can reach both Home Layout: One level     Bathroom Shower/Tub: Tub only         Home Equipment: Environmental consultant - 2 wheels;Cane - single point          Prior Functioning/Environment Level of Independence: Independent with assistive device(s)        Comments: Patient states 2 months prior to surgery, she began to use SPC due to pain.        OT Problem List: Decreased range of motion;Decreased activity tolerance;Pain;Decreased knowledge of use of DME or AE      OT Treatment/Interventions: Self-care/ADL training;Therapeutic exercise;Energy conservation;Therapeutic activities;DME and/or AE instruction    OT Goals(Current goals can be found in the care plan section) Acute Rehab OT Goals Patient Stated Goal: I want to feel like I can take care of myself again OT Goal Formulation: With patient Time For Goal Achievement: 03/18/19 Potential to Achieve Goals: Good  OT Frequency: Min 2X/week   Barriers to D/C:            Co-evaluation              AM-PAC OT "6 Clicks" Daily Activity     Outcome Measure Help from another person eating meals?: None Help from another person taking care of personal grooming?: None Help from another person toileting, which includes using toliet, bedpan, or urinal?: A Little Help from another person bathing (including washing, rinsing, drying)?: A Lot Help from another person to put on and taking off regular upper body clothing?: None Help from another person to put on and taking off regular lower body clothing?: A Lot 6 Click Score: 19   End of Session Equipment Utilized During Treatment: Rolling walker  Activity Tolerance: Patient limited by pain Patient left: in chair;with call bell/phone within reach;with chair alarm set;Other (comment)(polar care applied)  OT Visit Diagnosis: Pain;Other (comment)(L TKR) Pain - Right/Left: Left Pain -  part of body: Knee                Time: [id number] OT Time Calculation (min): 20 min Charges:  OT General Charges $OT Visit: 1 Visit OT Evaluation $OT Eval Low Complexity: 1 Low OT Treatments $Self Care/Home Management : 8-22 mins  Baldomero Lamy, MS, OTR/L 03/05/19, 1:11 PM

## 2019-03-05 NOTE — Anesthesia Postprocedure Evaluation (Signed)
Anesthesia Post Note  Patient: Marcia Maldonado  Procedure(s) Performed: LEFT TOTAL KNEE ARTHROPLASTY (Left Knee)  Patient location during evaluation: Nursing Unit Anesthesia Type: Spinal Level of consciousness: awake and alert and oriented Pain management: pain level controlled Vital Signs Assessment: post-procedure vital signs reviewed and stable Respiratory status: spontaneous breathing and respiratory function stable Cardiovascular status: stable Postop Assessment: no headache, no backache, no apparent nausea or vomiting, able to ambulate, adequate PO intake and patient able to bend at knees Anesthetic complications: no     Last Vitals:  Vitals:   03/05/19 0428 03/05/19 0755  BP: 95/75 122/81  Pulse: 74 76  Resp: 18 17  Temp: 36.9 C 36.8 C  SpO2: 97% 96%    Last Pain:  Vitals:   03/05/19 0755  TempSrc: Oral  PainSc:                  Lanora Manis

## 2019-03-05 NOTE — Discharge Instructions (Signed)

## 2019-03-05 NOTE — Progress Notes (Signed)
Physical Therapy Treatment Patient Details Name: Marcia Maldonado MRN: [id number] DOB: 10-Dec-1970 Today's Date: 03/05/2019    History of Present Illness Pt is 49 yo female s/p L TKA, WBAT. PMH of asthma, HTN.    PT Comments    Patient alert, in chair, agreeable to PT. Stated her pain was mildly improved compared to this AM. Of note, with mobility and exercises, pain 8/10 in L knee. The patient performed seated therapeutic exercises, modA for LAQ and AAROM/PROM for L heel slide, ROM significantly limited due to pain. Sit <> stand with CGA and RW verbal cues for technique/hand placement, verbalized understanding. The patient ambulated ~77ft with RW and CGA. Exhibited  improved quality of gait this session, still exhibited significant pain signs/symptoms, some SOB noted during ambulation as well. Pt also stood at sink and brushed her teeth with PT, CGA/supervision. Pt in bed with bone foam in place at end of session. The patient would benefit from further skilled PT intervention to return to PLOF as able. Will need stair training prior to discharge.    Follow Up Recommendations  Home health PT;Supervision for mobility/OOB     Equipment Recommendations  Rolling walker with 5" wheels    Recommendations for Other Services       Precautions / Restrictions Precautions Precautions: Knee Precaution Booklet Issued: No Precaution Comments: Provided hand out of adaptive equipment Restrictions Weight Bearing Restrictions: Yes LLE Weight Bearing: Weight bearing as tolerated    Mobility  Bed Mobility Overal bed mobility: Needs Assistance Bed Mobility: Sit to Supine       Sit to supine: Min assist   General bed mobility comments: for LLE management  Transfers Overall transfer level: Needs assistance Equipment used: Rolling walker (2 wheeled) Transfers: Sit to/from Stand Sit to Stand: Min guard Stand pivot transfers: Min guard       General transfer comment: Requires cues for  sequencing and hand placement  Ambulation/Gait Ambulation/Gait assistance: Min guard Gait Distance (Feet): 30 Feet Assistive device: Rolling walker (2 wheeled) Gait Pattern/deviations: Step-to pattern;Antalgic Gait velocity: decreased   General Gait Details: Pt with improved quality of gait this session, still exhibited significant pain signs/symptoms, some SOB noted during ambulation as well   Stairs             Wheelchair Mobility    Modified Rankin (Stroke Patients Only)       Balance Overall balance assessment: Needs assistance Sitting-balance support: Feet supported Sitting balance-Leahy Scale: Fair       Standing balance-Leahy Scale: Fair Standing balance comment: able to stand at sink and brush teeth, single UE propped for safety                            Cognition Arousal/Alertness: Awake/alert Behavior During Therapy: WFL for tasks assessed/performed Overall Cognitive Status: Within Functional Limits for tasks assessed                                        Exercises Total Joint Exercises Ankle Circles/Pumps: AROM;Strengthening;Both;10 reps Quad Sets: AROM;Strengthening;Left;10 reps Long Arc Quad: AAROM;Strengthening;Left;10 reps Knee Flexion: AAROM;Strengthening;Left;10 reps;PROM Goniometric ROM: -7 to 65 deg, very limited by pain Other Exercises Other Exercises: Pt stood at sink with single UE propped, very little weight bearing on LLE noted. CGA/supervision    General Comments        Pertinent Vitals/Pain Pain  Assessment: 0-10 Pain Score: 8  Pain Location: L knee Pain Descriptors / Indicators: Aching;Grimacing;Guarding;Moaning Pain Intervention(s): Limited activity within patient's tolerance;Monitored during session;Premedicated before session;Repositioned;Ice applied    Home Living Family/patient expects to be discharged to:: Private residence Living Arrangements: Spouse/significant  other;Children;Parent Available Help at Discharge: Family;Available 24 hours/day Type of Home: House Home Access: Stairs to enter Entrance Stairs-Rails: Right;Left;Can reach both Home Layout: One level Home Equipment: Environmental consultant - 2 wheels;Cane - single point      Prior Function Level of Independence: Independent with assistive device(s)      Comments: Patient states 2 months prior to surgery, she began to use SPC due to pain.   PT Goals (current goals can now be found in the care plan section) Acute Rehab PT Goals Patient Stated Goal: I want to feel like I can take care of myself again Progress towards PT goals: Progressing toward goals    Frequency    BID      PT Plan Current plan remains appropriate    Co-evaluation              AM-PAC PT "6 Clicks" Mobility   Outcome Measure  Help needed turning from your back to your side while in a flat bed without using bedrails?: A Little Help needed moving from lying on your back to sitting on the side of a flat bed without using bedrails?: A Little Help needed moving to and from a bed to a chair (including a wheelchair)?: A Little Help needed standing up from a chair using your arms (e.g., wheelchair or bedside chair)?: A Little Help needed to walk in hospital room?: A Little Help needed climbing 3-5 steps with a railing? : A Lot 6 Click Score: 17    End of Session Equipment Utilized During Treatment: Gait belt Activity Tolerance: Patient tolerated treatment well;Patient limited by pain Patient left: in bed;with call bell/phone within reach;with bed alarm set;with SCD's reapplied;Other (comment)(bone foam in place, polar care on and activated) Nurse Communication: Mobility status PT Visit Diagnosis: Other abnormalities of gait and mobility (R26.89);Difficulty in walking, not elsewhere classified (R26.2);Muscle weakness (generalized) (M62.81);Pain Pain - Right/Left: Left Pain - part of body: Knee     Time: 1259-1346 PT  Time Calculation (min) (ACUTE ONLY): 47 min  Charges:  $Therapeutic Exercise: 23-37 mins $Therapeutic Activity: 8-22 mins                     Lieutenant Diego PT, DPT 2:33 PM,03/05/19

## 2019-03-05 NOTE — Progress Notes (Signed)
   Subjective: 1 Day Post-Op Procedure(s) (LRB): LEFT TOTAL KNEE ARTHROPLASTY (Left) Patient reports pain as severe.   Patient is complaining of some nausea after taking Dilaudid.  No vomiting. Denies any CP, SOB, ABD pain. We will start physical therapy today.  Plan is to go Home after hospital stay.  Objective: Vital signs in last 24 hours: Temp:  [97 F (36.1 C)-99.5 F (37.5 C)] 98.3 F (36.8 C) (02/19 0755) Pulse Rate:  [62-84] 76 (02/19 0755) Resp:  [12-28] 17 (02/19 0755) BP: (95-157)/(57-95) 122/81 (02/19 0755) SpO2:  [92 %-100 %] 96 % (02/19 0755) Weight:  [115.2 kg] 115.2 kg (02/18 1901)  Intake/Output from previous day: 02/18 0701 - 02/19 0700 In: 2769.9 [P.O.:240; I.V.:2229.8; IV Piggyback:300.1] Out: 2550 [Urine:2500; Blood:50] Intake/Output this shift: No intake/output data recorded.  Recent Labs    03/04/19 0719 03/05/19 0621  HGB 15.3* 13.0   Recent Labs    03/04/19 0719 03/05/19 0621  WBC 5.7 8.5  RBC 4.97 4.30  HCT 43.7 39.1  PLT 187 159   Recent Labs    03/04/19 0719 03/05/19 0621  NA 139 136  K 3.4* 3.7  CL 106 104  CO2 25 24  BUN 10 12  CREATININE 1.12* 1.30*  GLUCOSE 99 105*  CALCIUM 8.6* 8.1*   Recent Labs    03/04/19 0719  INR 1.1    EXAM General - Patient is Alert, Appropriate and Oriented Extremity - Neurovascular intact Sensation intact distally Intact pulses distally Dorsiflexion/Plantar flexion intact No cellulitis present Compartment soft Dressing - dressing C/D/I and no drainage, Praveena intact without drainage Motor Function - intact, moving foot and toes well on exam.   Past Medical History:  Diagnosis Date  . Asthma   . Hypertension   . Migraine     Assessment/Plan:   1 Day Post-Op Procedure(s) (LRB): LEFT TOTAL KNEE ARTHROPLASTY (Left) Active Problems:   Knee joint replacement status, left  Estimated body mass index is 39.77 kg/m as calculated from the following:   Height as of this encounter:  5' 7.01" (1.702 m).   Weight as of this encounter: 115.2 kg. Advance diet Up with therapy  Needs bowel movement Continue to monitor pain make adjustments as needed.  Will see how she tolerates Norco 7.5 mg, 1 to 2 tablets every 6 hours. Zofran, Reglan as needed for nausea Acute renal insufficiency -slight elevation in creatinine to 1.3.  We will continue with IV fluids and monitor blood pressure. Vital signs are stable Recheck labs in the morning Care management to assist with discharge.  DVT Prophylaxis - Lovenox, Foot Pumps and TED hose Weight-Bearing as tolerated to left leg   T. Rachelle Hora, PA-C [facility] 03/05/2019, 8:15 AM

## 2019-03-05 NOTE — Discharge Summary (Signed)
Physician Discharge Summary  Patient ID: Marcia Maldonado MRN: [id number] DOB/AGE: [birth year] 49 y.o.  Admit date: 03/04/2019 Discharge date: 03/07/2019 Admission Diagnoses:  Knee joint replacement status, left [Z96.652] Total knee replacement status [Z96.659]   Discharge Diagnoses: Patient Active Problem List   Diagnosis Date Noted  . 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 Asthma 08/25/2013  . HTN (hypertension) 08/25/2013  . Fibroids 08/11/2013    Past Medical History:  Diagnosis Date  . Asthma   . Hypertension   . Migraine      Transfusion: none   Consultants (if any):   Discharged Condition: Improved  Hospital Course: Marcia Maldonado is an 49 y.o. female who was admitted 03/04/2019 with a diagnosis of left knee osteoarthritis and went to the operating room on 03/04/2019 and underwent the above named procedures.    Surgeries: Procedure(s): LEFT TOTAL KNEE ARTHROPLASTY on 03/04/2019 Patient tolerated the surgery well. Taken to PACU where she was stabilized and then transferred to the orthopedic floor.  Started on Lovenox 30 mg q 12 hrs. Foot pumps applied bilaterally at 80 mm. Heels elevated on bed with rolled towels. No evidence of DVT. Negative Homan. Physical therapy started on day #1 for gait training and transfer. OT started day #1 for ADL and assisted devices.  Patient's foley was d/c on day #1.   Implants: Medacta GM K sphere system left 4 femur, 4 tibia with short stem, 10 mm insert and 3 patella, all components cemented  She was given perioperative antibiotics:  Anti-infectives (From admission, onward)   Start     Dose/Rate Route Frequency Ordered Stop   03/04/19 1500  ceFAZolin (ANCEF) IVPB 2g/100 mL premix     2 g 200 mL/hr over 30 Minutes Intravenous Every 6 hours 03/04/19 1321 03/05/19 0408   03/04/19 0700  ceFAZolin (ANCEF) IVPB 2g/100 mL premix     2 g 200 mL/hr over 30 Minutes Intravenous On call  to O.R. 03/04/19 [id number] 03/04/19 0920    .  She was given sequential compression devices, early ambulation, and Lovenox TEDs for DVT prophylaxis.  She benefited maximally from the hospital stay and there were no complications.    Recent vital signs:  Vitals:   03/07/19 0005 03/07/19 0810  BP: (!) 144/95 (!) 148/94  Pulse: 91 85  Resp: 20 18  Temp: 98.9 F (37.2 C) 98.6 F (37 C)  SpO2: 98% 95%    Recent laboratory studies:  Lab Results  Component Value Date   HGB 12.5 03/07/2019   HGB 12.9 03/06/2019   HGB 13.0 03/05/2019   Lab Results  Component Value Date   WBC 8.5 03/07/2019   PLT 133 (L) 03/07/2019   Lab Results  Component Value Date   INR 1.1 03/04/2019   Lab Results  Component Value Date   NA 139 03/07/2019   K 3.6 03/07/2019   CL 102 03/07/2019   CO2 29 03/07/2019   BUN 11 03/07/2019   CREATININE 1.03 (H) 03/07/2019   GLUCOSE 92 03/07/2019    Discharge Medications:   Allergies as of 03/07/2019      Reactions   Erythromycin Hives   The patient personally hasn't had reaction but says that she has family history of reacting to it (since Native American) The patient personally hasn't had reaction but says that she has family history of reacting to it (since Native American)   Phenylephrine-guaifenesin Hives   Guaifenesin Palpitations   HR goes  up   Guaifenesin & Derivatives Palpitations   HR goes up      Medication List    STOP taking these medications   etodolac 500 MG tablet Commonly known as: LODINE   ibuprofen 800 MG tablet Commonly known as: ADVIL     TAKE these medications   albuterol 108 (90 Base) MCG/ACT inhaler Commonly known as: VENTOLIN HFA Inhale 2 puffs into the lungs every 6 (six) hours as needed for shortness of breath. For SOB   amLODipine 10 MG tablet Commonly known as: NORVASC Take 10 mg by mouth daily.   enoxaparin 40 MG/0.4ML injection Commonly known as: Lovenox Inject 0.4 mLs (40 mg total) into the skin daily for 14  days.   hydrochlorothiazide 25 MG tablet Commonly known as: HYDRODIURIL Take 12.5 mg by mouth daily.   HYDROcodone-acetaminophen 7.5-325 MG tablet Commonly known as: NORCO Take 1-2 tablets by mouth every 6 (six) hours as needed for moderate pain or severe pain.   labetalol 200 MG tablet Commonly known as: NORMODYNE Take 200 mg by mouth 2 (two) times daily.   ondansetron 4 MG disintegrating tablet Commonly known as: ZOFRAN-ODT Take 1 tablet (4 mg total) by mouth every 8 (eight) hours as needed for nausea or vomiting.   ondansetron 4 MG tablet Commonly known as: ZOFRAN Take 1 tablet (4 mg total) by mouth every 6 (six) hours as needed for nausea.   promethazine-codeine 6.25-10 MG/5ML syrup Commonly known as: PHENERGAN with CODEINE Take 1-2 teaspoons every 6 hours as needed for severe cough. May cause drowsiness.   SUMAtriptan 25 MG tablet Commonly known as: IMITREX Take 25 mg by mouth every 2 (two) hours as needed for migraine. May repeat in 2 hours if headache persists or recurs.   topiramate 50 MG tablet Commonly known as: TOPAMAX Take 50 mg by mouth daily.   traMADol 50 MG tablet Commonly known as: ULTRAM Take 1-2 tablets (50-100 mg total) by mouth every 6 (six) hours.            Durable Medical Equipment  (From admission, onward)         Start     Ordered   03/04/19 1322  DME 3 n 1  Once     03/04/19 1321   03/04/19 1322  DME Bedside commode  Once    Question:  Patient needs a bedside commode to treat with the following condition  Answer:  Knee joint replacement status, left   03/04/19 1321   03/04/19 1322  DME Walker rolling  Once    Question Answer Comment  Walker: With 5 Inch Wheels   Patient needs a walker to treat with the following condition Knee joint replacement status, left      03/04/19 1321          Diagnostic Studies: DG Knee 1-2 Views Left  Result Date: 03/04/2019 CLINICAL DATA:  Osteoarthritis of the left knee. Status post total knee  arthroplasty. EXAM: LEFT KNEE - 1-2 VIEW COMPARISON:  Radiographs dated 12/10/2016 and MRI dated 02/06/2019 FINDINGS: The components of the total knee prosthesis appear in good position. No fractures. Expected postsurgical air in the joint. IMPRESSION: Satisfactory appearance of the left knee after total knee arthroplasty. Electronically Signed   By: Lorriane Shire M.D.   On: 03/04/2019 11:37   MR KNEE LEFT WO CONTRAST  Result Date: 02/06/2019 CLINICAL DATA:  No known injury, left knee pain along the lateral aspect. EXAM: MRI OF THE LEFT KNEE WITHOUT CONTRAST TECHNIQUE: Multiplanar, multisequence  MR imaging of the knee was performed. No intravenous contrast was administered. COMPARISON:  None. FINDINGS: MENISCI Medial meniscus: Severe maceration of the body and posterior horn of the medial meniscus. Lateral meniscus:  Intact. LIGAMENTS Cruciates:  Intact ACL and PCL. Collaterals: Medial collateral ligament is intact. Lateral collateral ligament complex is intact. CARTILAGE Patellofemoral: Extensive full-thickness cartilage loss of the lateral patellofemoral compartment with subchondral reactive marrow edema and cystic changes. High-grade partial-thickness cartilage loss of the medial patellofemoral compartment. Medial: Partial-thickness cartilage loss of the medial femorotibial compartment with high-grade partial-thickness cartilage loss along the periphery of the medial tibial plateau with subchondral cystic changes. Lateral: Partial thickness cartilage loss along the medial aspect of the lateral femorotibial compartment. Joint: Small joint effusion. No plical thickening. Normal Hoffa's fat. Popliteal Fossa:  No Baker cyst. Intact popliteus tendon. Extensor Mechanism: Intact quadriceps tendon. Intact patellar tendon. Intact medial patellar retinaculum. Intact lateral patellar retinaculum. Intact MPFL. Bones: No acute osseous abnormality. No aggressive osseous lesion. Tricompartmental marginal osteophytes.  Other: No fluid collection or hematoma.  Muscles are normal. IMPRESSION: 1. Severe maceration of the body and posterior horn of the medial meniscus. 2. Tricompartmental cartilage abnormalities as described above most severe in the lateral patellofemoral compartment. Electronically Signed   By: Kathreen Devoid   On: 02/06/2019 18:39   Disposition: Plan for discharge home on the afternoon of 03/07/19.    Follow-up Information    Duanne Guess, PA-C Follow up in 2 week(s).   Specialties: Orthopedic Surgery, Emergency Medicine Contact information: [place] [address] [phone]          Signed: Judson Roch PA-C 03/07/2019, 9:06 AM

## 2019-03-05 NOTE — Progress Notes (Signed)
Physical Therapy Treatment Patient Details Name: Marcia Maldonado MRN: [id number] DOB: 05-26-1970 Today's Date: 03/05/2019    History of Present Illness Pt is 49 yo female s/p L TKA, WBAT. PMH of asthma, HTN.    PT Comments    Pt alert, reported L knee pain as 5/10, premedicated prior to PT session. Pt still exhibited significant pain signs/symptoms with all L knee movement. Pt needed min-modA to complete therapeutic exercises. Supine to sit with minA for LLE management. Sit <> stand from EOB and from recliner, CGA. Verbal cues ea time for hand placement, weight shift, and RW use. The patient ambulated ~65ft with CGA and RW, close chair follow for safety. Pt instructed in step to gait pattern to increase UE support to offweight LLE. Pt exhibited fatigue, SOB, and mild unsteadiness with fatigue during ambulation. Pt denied lightheadedness, nausea, dizziness throughout session, BP WFLs but did exhibit mild signs once sitting chair after ambulation. Pt up in chair with all needs in reach at end of session. The patient would benefit from further skilled PT intervention to return to PLOF as able.      Follow Up Recommendations  Home health PT;Supervision for mobility/OOB     Equipment Recommendations  Rolling walker with 5" wheels    Recommendations for Other Services       Precautions / Restrictions Precautions Precautions: Knee Precaution Booklet Issued: Yes (comment) Restrictions Weight Bearing Restrictions: Yes LLE Weight Bearing: Weight bearing as tolerated    Mobility  Bed Mobility Overal bed mobility: Needs Assistance Bed Mobility: Supine to Sit     Supine to sit: Min assist;HOB elevated     General bed mobility comments: for LLE management  Transfers Overall transfer level: Needs assistance Equipment used: Rolling walker (2 wheeled) Transfers: Sit to/from Stand Sit to Stand: Min guard            Ambulation/Gait Ambulation/Gait assistance: Min guard Gait  Distance (Feet): 60 Feet Assistive device: Rolling walker (2 wheeled)       General Gait Details: Instructed in step to gait pattern to encourage UE support due to significant LLE pain   Stairs             Wheelchair Mobility    Modified Rankin (Stroke Patients Only)       Balance Overall balance assessment: Needs assistance Sitting-balance support: Feet supported Sitting balance-Leahy Scale: Fair       Standing balance-Leahy Scale: Fair                              Cognition Arousal/Alertness: Awake/alert Behavior During Therapy: WFL for tasks assessed/performed Overall Cognitive Status: Within Functional Limits for tasks assessed                                        Exercises Total Joint Exercises Ankle Circles/Pumps: AROM;Strengthening;Both;10 reps Quad Sets: AROM;Strengthening;Left;10 reps Short Arc Quad: AAROM;Strengthening;Left;10 reps Heel Slides: AAROM;Strengthening;Left;10 reps    General Comments        Pertinent Vitals/Pain Pain Assessment: 0-10 Pain Score: 8  Pain Location: L knee with mobility Pain Descriptors / Indicators: Aching;Grimacing;Guarding Pain Intervention(s): Limited activity within patient's tolerance;Monitored during session;Premedicated before session;Repositioned;Ice applied    Home Living                      Prior Function  PT Goals (current goals can now be found in the care plan section) Progress towards PT goals: Progressing toward goals    Frequency    BID      PT Plan Current plan remains appropriate    Co-evaluation              AM-PAC PT "6 Clicks" Mobility   Outcome Measure  Help needed turning from your back to your side while in a flat bed without using bedrails?: A Little Help needed moving from lying on your back to sitting on the side of a flat bed without using bedrails?: A Little Help needed moving to and from a bed to a chair  (including a wheelchair)?: A Little Help needed standing up from a chair using your arms (e.g., wheelchair or bedside chair)?: A Little Help needed to walk in hospital room?: A Little Help needed climbing 3-5 steps with a railing? : A Lot 6 Click Score: 17    End of Session Equipment Utilized During Treatment: Gait belt Activity Tolerance: Patient tolerated treatment well;Patient limited by pain Patient left: with chair alarm set;in chair;with call bell/phone within reach;with nursing/sitter in room;with family/visitor present Nurse Communication: Mobility status PT Visit Diagnosis: Other abnormalities of gait and mobility (R26.89);Difficulty in walking, not elsewhere classified (R26.2);Muscle weakness (generalized) (M62.81);Pain Pain - Right/Left: Left Pain - part of body: Knee     Time: [id number] PT Time Calculation (min) (ACUTE ONLY): 47 min  Charges:  $Gait Training: 8-22 mins $Therapeutic Exercise: 23-37 mins                     Lieutenant Diego PT, DPT 10:00 AM,03/05/19

## 2019-03-06 DIAGNOSIS — Z96659 Presence of unspecified artificial knee joint: Secondary | ICD-10-CM

## 2019-03-06 LAB — BASIC METABOLIC PANEL
Anion gap: 8 (ref 5–15)
BUN: 10 mg/dL (ref 6–20)
CO2: 26 mmol/L (ref 22–32)
Calcium: 8.4 mg/dL — ABNORMAL LOW (ref 8.9–10.3)
Chloride: 102 mmol/L (ref 98–111)
Creatinine, Ser: 1.14 mg/dL — ABNORMAL HIGH (ref 0.44–1.00)
GFR calc Af Amer: >60 mL/min (ref >60)
GFR calc non Af Amer: 56 mL/min — ABNORMAL LOW (ref >60)
Glucose, Bld: 102 mg/dL — ABNORMAL HIGH (ref 70–99)
Potassium: 3.9 mmol/L (ref 3.5–5.1)
Sodium: 136 mmol/L (ref 135–145)

## 2019-03-06 LAB — CBC
HCT: 37.8 % (ref 36.0–46.0)
Hemoglobin: 12.9 g/dL (ref 12.0–15.0)
MCH: 30.7 pg (ref 26.0–34.0)
MCHC: 34.1 g/dL (ref 30.0–36.0)
MCV: 90 fL (ref 80.0–100.0)
Platelets: 142 10*3/uL — ABNORMAL LOW (ref 150–400)
RBC: 4.2 MIL/uL (ref 3.87–5.11)
RDW: 12.5 % (ref 11.5–15.5)
WBC: 10 10*3/uL (ref 4.0–10.5)
nRBC: 0 % (ref 0.0–0.2)

## 2019-03-06 MED ORDER — TRAMADOL HCL 50 MG PO TABS
50.0000 mg | ORAL_TABLET | Freq: Four times a day (QID) | ORAL | Status: DC
  Administered 2019-03-06 – 2019-03-07 (×3): 100 mg via ORAL
  Filled 2019-03-06 (×4): qty 2

## 2019-03-06 NOTE — Progress Notes (Signed)
Physical Therapy Treatment Patient Details Name: Marcia Maldonado MRN: [id number] DOB: 15-Sep-1970 Today's Date: 03/06/2019    History of Present Illness Pt is 49 yo female s/p L TKA, WBAT. PMH of asthma, HTN.    PT Comments    Patient received in chair, reports she is tired. Agrees to PT session. Patient demonstrates increased fatigue with mobility this session. Had one LOB in bathroom while turning to toilet. She was able to catch herself on rail next to toilet. Ambulated 75 feet to steps in gym, required prolonged seated rest then she ambulated up 2 steps and reported she felt dizzy, so she was assisted into wheelchair from steps. She was wheeled back to room. Required supervision for sit to supine. Patient will continue to benefit from skilled PT while here to improve mobility, strength, and activity tolerance.       Follow Up Recommendations  Home health PT;Supervision for mobility/OOB     Equipment Recommendations  Rolling walker with 5" wheels    Recommendations for Other Services       Precautions / Restrictions Precautions Precautions: Fall Restrictions Weight Bearing Restrictions: No LLE Weight Bearing: Weight bearing as tolerated    Mobility  Bed Mobility Overal bed mobility: Modified Independent Bed Mobility: Sit to Supine       Sit to supine: Supervision   General bed mobility comments: not assessed, patient in recliner upon arrival  Transfers Overall transfer level: Needs assistance Equipment used: Rolling walker (2 wheeled) Transfers: Sit to/from Stand Sit to Stand: Supervision            Ambulation/Gait Ambulation/Gait assistance: Min guard Gait Distance (Feet): 75 Feet Assistive device: Rolling walker (2 wheeled) Gait Pattern/deviations: Step-through pattern;Decreased weight shift to left Gait velocity: decreased   General Gait Details: heavy use of UEs on RW with ambulation. increased fatigue this pm vs this morning,   Stairs Stairs:  Yes Stairs assistance: Min guard Stair Management: Two rails;Step to pattern Number of Stairs: 2 General stair comments: patient became dizzy on steps therefore she only did 2 steps and then was assisted into wheelchair due to dizziness.   Wheelchair Mobility    Modified Rankin (Stroke Patients Only)       Balance Overall balance assessment: Needs assistance Sitting-balance support: Feet supported Sitting balance-Leahy Scale: Good     Standing balance support: Bilateral upper extremity supported;During functional activity Standing balance-Leahy Scale: Fair Standing balance comment: patient had one LOB in bathroom, she was able to catch herself with rail next to toilet.                            Cognition Arousal/Alertness: Awake/alert Behavior During Therapy: WFL for tasks assessed/performed;Flat affect Overall Cognitive Status: Within Functional Limits for tasks assessed                                        Exercises Total Joint Exercises Ankle Circles/Pumps: AROM;10 reps;Both Quad Sets: AROM;Left;10 reps Heel Slides: AROM;10 reps;Left;Seated Hip ABduction/ADduction: AAROM;Left;10 reps Straight Leg Raises: AAROM;Left;10 reps    General Comments        Pertinent Vitals/Pain Pain Assessment: 0-10 Pain Score: 6  Pain Location: L knee Pain Descriptors / Indicators: Aching;Discomfort;Sore;Tightness Pain Intervention(s): Limited activity within patient's tolerance;Monitored during session;Premedicated before session    Home Living  Prior Function            PT Goals (current goals can now be found in the care plan section) Acute Rehab PT Goals Patient Stated Goal: I want to feel like I can take care of myself again PT Goal Formulation: With patient Time For Goal Achievement: 03/18/19 Potential to Achieve Goals: Good Progress towards PT goals: Progressing toward goals    Frequency    BID       PT Plan Current plan remains appropriate    Co-evaluation              AM-PAC PT "6 Clicks" Mobility   Outcome Measure  Help needed turning from your back to your side while in a flat bed without using bedrails?: A Little Help needed moving from lying on your back to sitting on the side of a flat bed without using bedrails?: A Little Help needed moving to and from a bed to a chair (including a wheelchair)?: A Little Help needed standing up from a chair using your arms (e.g., wheelchair or bedside chair)?: None Help needed to walk in hospital room?: A Little Help needed climbing 3-5 steps with a railing? : A Little 6 Click Score: 19    End of Session Equipment Utilized During Treatment: Gait belt Activity Tolerance: Patient limited by lethargy Patient left: in bed;with call bell/phone within reach Nurse Communication: Mobility status PT Visit Diagnosis: Difficulty in walking, not elsewhere classified (R26.2);Pain;Muscle weakness (generalized) (M62.81) Pain - Right/Left: Left Pain - part of body: Knee     Time: 1400-1435 PT Time Calculation (min) (ACUTE ONLY): 35 min  Charges:  $Gait Training: 23-37 mins $Therapeutic Exercise: 8-22 mins                     Verita Kuroda, PT, GCS 03/06/19,3:19 PM

## 2019-03-06 NOTE — Progress Notes (Signed)
Physical Therapy Treatment Patient Details Name: Marcia Maldonado MRN: [id number] DOB: 24-May-1970 Today's Date: 03/06/2019    History of Present Illness Pt is 49 yo female s/p L TKA, WBAT. PMH of asthma, HTN.    PT Comments    Patient received in recliner, agrees to PT session. Reports 6-7/10 on pain scale. Patient transfers sit to stand with supervision this visit. Ambulated to bathroom and then around nurses station with RW and supervision. Cues needed to increase knee flexion with ambulation. Slight fatigue with this distance. Patient performed seated LE strengthening and ROM exercises. Flexion to approx 80 degrees this visit. Patient will continue to benefit from skilled PT while here to improve mobility, decrease pain and improve strength.       Follow Up Recommendations  Home health PT;Supervision for mobility/OOB     Equipment Recommendations  Rolling walker with 5" wheels    Recommendations for Other Services       Precautions / Restrictions Precautions Precautions: Fall Restrictions Weight Bearing Restrictions: No LLE Weight Bearing: Weight bearing as tolerated    Mobility  Bed Mobility               General bed mobility comments: not assessed, patient in recliner upon arrival  Transfers Overall transfer level: Needs assistance Equipment used: Rolling walker (2 wheeled) Transfers: Sit to/from Stand Sit to Stand: Supervision            Ambulation/Gait Ambulation/Gait assistance: Supervision Gait Distance (Feet): 150 Feet Assistive device: Rolling walker (2 wheeled) Gait Pattern/deviations: Step-through pattern;Decreased weight shift to left Gait velocity: decreased   General Gait Details: heavy use of UEs on RW with ambulation. Slight fatigue,   Stairs             Wheelchair Mobility    Modified Rankin (Stroke Patients Only)       Balance Overall balance assessment: Needs assistance Sitting-balance support: Feet supported Sitting  balance-Leahy Scale: Good     Standing balance support: Bilateral upper extremity supported;During functional activity Standing balance-Leahy Scale: Good                              Cognition Arousal/Alertness: Awake/alert Behavior During Therapy: WFL for tasks assessed/performed Overall Cognitive Status: Within Functional Limits for tasks assessed                                        Exercises Total Joint Exercises Ankle Circles/Pumps: AROM;10 reps;Both Quad Sets: AROM;Left;10 reps Heel Slides: AROM;10 reps;Left;Seated Hip ABduction/ADduction: AAROM;Left;10 reps Straight Leg Raises: AAROM;Left;10 reps    General Comments        Pertinent Vitals/Pain Pain Assessment: 0-10 Pain Score: 7  Pain Location: L knee Pain Descriptors / Indicators: Aching;Grimacing;Guarding Pain Intervention(s): Monitored during session;Repositioned;Premedicated before session    Home Living                      Prior Function            PT Goals (current goals can now be found in the care plan section) Acute Rehab PT Goals Patient Stated Goal: I want to feel like I can take care of myself again PT Goal Formulation: With patient Time For Goal Achievement: 03/18/19 Potential to Achieve Goals: Good Progress towards PT goals: Progressing toward goals    Frequency    BID  PT Plan Current plan remains appropriate    Co-evaluation              AM-PAC PT "6 Clicks" Mobility   Outcome Measure  Help needed turning from your back to your side while in a flat bed without using bedrails?: A Little Help needed moving from lying on your back to sitting on the side of a flat bed without using bedrails?: A Little Help needed moving to and from a bed to a chair (including a wheelchair)?: A Little Help needed standing up from a chair using your arms (e.g., wheelchair or bedside chair)?: None Help needed to walk in hospital room?: A Little Help  needed climbing 3-5 steps with a railing? : A Little 6 Click Score: 19    End of Session Equipment Utilized During Treatment: Gait belt Activity Tolerance: Patient tolerated treatment well Patient left: in chair;with call bell/phone within reach Nurse Communication: Mobility status PT Visit Diagnosis: Difficulty in walking, not elsewhere classified (R26.2);Pain Pain - Right/Left: Left Pain - part of body: Knee     Time: [id number] PT Time Calculation (min) (ACUTE ONLY): 31 min  Charges:  $Gait Training: 8-22 mins $Therapeutic Exercise: 8-22 mins                     [employer], PT, GCS 03/06/19,12:24 PM

## 2019-03-06 NOTE — Progress Notes (Signed)
Subjective: 2 Days Post-Op Procedure(s) (LRB): LEFT TOTAL KNEE ARTHROPLASTY (Left) Patient reports pain as severe.   Switched from oxycodone to [place] yesterday. Denies any CP, SOB, ABD pain. Continue with PT today, current plan is for d/c home with HHPT. Plan is to go Home after hospital stay.  Objective: Vital signs in last 24 hours: Temp:  [98.6 F (37 C)-99.4 F (37.4 C)] 98.6 F (37 C) (02/20 0817) Pulse Rate:  [83-114] 114 (02/20 0817) Resp:  [18-20] 18 (02/20 0817) BP: (135-176)/(91-108) 147/99 (02/20 0817) SpO2:  [95 %-97 %] 96 % (02/20 0817)  Intake/Output from previous day: 02/19 0701 - 02/20 0700 In: 240 [P.O.:240] Out: -  Intake/Output this shift: Total I/O In: 240 [P.O.:240] Out: -   Recent Labs    03/04/19 0719 03/05/19 0621 03/06/19 0437  HGB 15.3* 13.0 12.9   Recent Labs    03/05/19 0621 03/06/19 0437  WBC 8.5 10.0  RBC 4.30 4.20  HCT 39.1 37.8  PLT 159 142*   Recent Labs    03/05/19 0621 03/06/19 0437  NA 136 136  K 3.7 3.9  CL 104 102  CO2 24 26  BUN 12 10  CREATININE 1.30* 1.14*  GLUCOSE 105* 102*  CALCIUM 8.1* 8.4*   Recent Labs    03/04/19 0719  INR 1.1    EXAM General - Patient is Alert, Appropriate and Oriented Extremity - Neurovascular intact Sensation intact distally Intact pulses distally Dorsiflexion/Plantar flexion intact No cellulitis present Compartment soft Dressing - dressing C/D/I and no drainage, Praveena intact without drainage Motor Function - intact, moving foot and toes well on exam.   Past Medical History:  Diagnosis Date  . Asthma   . Hypertension   . Migraine     Assessment/Plan:   2 Days Post-Op Procedure(s) (LRB): LEFT TOTAL KNEE ARTHROPLASTY (Left) Active Problems:   Knee joint replacement status, left  Estimated body mass index is 39.77 kg/m as calculated from the following:   Height as of this encounter: 5' 7.01" (1.702 m).   Weight as of this encounter: 115.2 kg. Advance diet Up  with therapy  Needs bowel movement Continue with Norco at this time in addition to tramadol.  Increased tramadol to 1-2 tabs every 6 hours. Acute renal insufficiency, Cr 1.14 and BUN 10 today, trending to normal levels.  Continue gentle IV hydration and increase oral intact. CBC and BMP ordered for tomorrow morning. Continue to monitor pain levels. Continue to work on BM. Plan for discharge home tomorrow.  DVT Prophylaxis - Lovenox, Foot Pumps and TED hose Weight-Bearing as tolerated to left leg  J. Cameron Proud, PA-C [place] 03/06/2019, 11:20 AM

## 2019-03-07 LAB — CBC
HCT: 37.3 % (ref 36.0–46.0)
Hemoglobin: 12.5 g/dL (ref 12.0–15.0)
MCH: 30.3 pg (ref 26.0–34.0)
MCHC: 33.5 g/dL (ref 30.0–36.0)
MCV: 90.5 fL (ref 80.0–100.0)
Platelets: 133 10*3/uL — ABNORMAL LOW (ref 150–400)
RBC: 4.12 MIL/uL (ref 3.87–5.11)
RDW: 12.5 % (ref 11.5–15.5)
WBC: 8.5 10*3/uL (ref 4.0–10.5)
nRBC: 0 % (ref 0.0–0.2)

## 2019-03-07 LAB — BASIC METABOLIC PANEL
Anion gap: 8 (ref 5–15)
BUN: 11 mg/dL (ref 6–20)
CO2: 29 mmol/L (ref 22–32)
Calcium: 8.5 mg/dL — ABNORMAL LOW (ref 8.9–10.3)
Chloride: 102 mmol/L (ref 98–111)
Creatinine, Ser: 1.03 mg/dL — ABNORMAL HIGH (ref 0.44–1.00)
GFR calc Af Amer: >60 mL/min (ref >60)
GFR calc non Af Amer: >60 mL/min (ref >60)
Glucose, Bld: 92 mg/dL (ref 70–99)
Potassium: 3.6 mmol/L (ref 3.5–5.1)
Sodium: 139 mmol/L (ref 135–145)

## 2019-03-07 MED ORDER — TRAMADOL HCL 50 MG PO TABS: 50.0000 mg | ORAL_TABLET | Freq: Four times a day (QID) | ORAL | 0 refills | Status: DC

## 2019-03-07 NOTE — Plan of Care (Signed)
  Problem: Education: Goal: Knowledge of General Education information will improve Description: Including pain rating scale, medication(s)/side effects and non-pharmacologic comfort measures Outcome: Progressing   Problem: Clinical Measurements: Goal: Ability to maintain clinical measurements within normal limits will improve Outcome: Progressing Goal: Will remain free from infection Outcome: Progressing Goal: Diagnostic test results will improve Outcome: Progressing   Problem: Activity: Goal: Risk for activity intolerance will decrease Outcome: Progressing   Problem: Elimination: Goal: Will not experience complications related to bowel motility Outcome: Progressing Goal: Will not experience complications related to urinary retention Outcome: Progressing

## 2019-03-07 NOTE — TOC Transition Note (Signed)
Transition of Care [facility]) - CM/SW Discharge Note   Patient Details  Name: Marcia Maldonado MRN: [id number] Date of Birth: 07-31-1970  Transition of Care [facility]) CM/SW Contact:  [name], LCSW Phone [phone] 03/07/2019, 1:43 PM   Clinical Narrative:    Pt medically stable for discharge. HH was set up with [facility]. Pt stated that she already has a walker at home CSW delivered 3n1 to pt room. Pt spouse will be transporting her home.    Final next level of care: [place] Barriers to Discharge: No Barriers Identified   Patient Goals and CMS Choice Patient states their goals for this hospitalization and ongoing recovery are:: to get stronger CMS Medicare.gov Compare Post Acute Care list provided to:: Patient Choice offered to / list presented to : Patient  Discharge Placement                Patient to be transferred to facility by: Spouse Name of family member notified: [name] Patient and family notified of of transfer: 03/07/19  Discharge Plan and Services                DME Arranged: 3-N-1 DME Agency: [employer] Date DME Agency Contacted: 03/07/19 Time DME Agency Contacted: [id number] Representative spoke with at DME Agency: [place]: PT, OT [place] Agency: [place] ([place]) Date [place]: 03/07/19 Time [place]: [place] Representative spoke with at [place]: [place] ([place]) Interventions     Readmission Risk Interventions No flowsheet data found.

## 2019-03-07 NOTE — Plan of Care (Signed)
  Problem: Education: Goal: Knowledge of General Education information will improve Description: Including pain rating scale, medication(s)/side effects and non-pharmacologic comfort measures Outcome: Adequate for Discharge   Problem: Health Behavior/Discharge Planning: Goal: Ability to manage health-related needs will improve Outcome: Adequate for Discharge   Problem: Clinical Measurements: Goal: Ability to maintain clinical measurements within normal limits will improve Outcome: Adequate for Discharge Goal: Will remain free from infection Outcome: Adequate for Discharge Goal: Diagnostic test results will improve Outcome: Adequate for Discharge Goal: Respiratory complications will improve Outcome: Adequate for Discharge Goal: Cardiovascular complication will be avoided Outcome: Adequate for Discharge   Problem: Activity: Goal: Risk for activity intolerance will decrease Outcome: Adequate for Discharge   Problem: Nutrition: Goal: Adequate nutrition will be maintained Outcome: Adequate for Discharge   Problem: Coping: Goal: Level of anxiety will decrease Outcome: Adequate for Discharge   Problem: Elimination: Goal: Will not experience complications related to bowel motility Outcome: Adequate for Discharge Goal: Will not experience complications related to urinary retention Outcome: Adequate for Discharge   Problem: Pain Managment: Goal: General experience of comfort will improve Outcome: Adequate for Discharge   Problem: Safety: Goal: Ability to remain free from injury will improve Outcome: Adequate for Discharge   Problem: Skin Integrity: Goal: Risk for impaired skin integrity will decrease Outcome: Adequate for Discharge   Problem: Acute Rehab PT Goals(only PT should resolve) Goal: Pt Will Go Supine/Side To Sit Outcome: Adequate for Discharge Goal: Patient Will Transfer Sit To/From Stand Outcome: Adequate for Discharge Goal: Pt Will Ambulate Outcome: Adequate  for Discharge Goal: Pt Will Go Up/Down Stairs Outcome: Adequate for Discharge   Problem: Acute Rehab OT Goals (only OT should resolve) Goal: Pt. Will Perform Lower Body Dressing Outcome: Adequate for Discharge Goal: Pt. Will Transfer To Toilet Outcome: Adequate for Discharge Goal: Pt. Will Perform Toileting-Clothing Manipulation Outcome: Adequate for Discharge Goal: OT Additional ADL Goal #1 Outcome: Adequate for Discharge

## 2019-03-07 NOTE — Progress Notes (Signed)
Physical Therapy Treatment Patient Details Name: Marcia Maldonado MRN: [id number] DOB: 1971/01/10 Today's Date: 03/07/2019    History of Present Illness Pt is 49 yo female s/p L TKA, WBAT. PMH of asthma, HTN.    PT Comments    Walked unit with RW and min guard/supervision and completed 4 steps with bilateral rails with min guard.  No dizziness noted today and while slow gait with fatigue noted she has no LOB.  No further questions regarding  Mobility or discharge.  Encouraged continued HEP and stretching.   Follow Up Recommendations  Home health PT;Supervision for mobility/OOB     Equipment Recommendations  Rolling walker with 5" wheels    Recommendations for Other Services       Precautions / Restrictions Precautions Precautions: Fall Restrictions Weight Bearing Restrictions: No LLE Weight Bearing: Weight bearing as tolerated    Mobility  Bed Mobility               General bed mobility comments: not assessed, patient in recliner upon arrival  Transfers Overall transfer level: Needs assistance Equipment used: Rolling walker (2 wheeled) Transfers: Sit to/from Stand Sit to Stand: Supervision            Ambulation/Gait Ambulation/Gait assistance: Min guard Gait Distance (Feet): 160 Feet Assistive device: Rolling walker (2 wheeled) Gait Pattern/deviations: Step-through pattern;Decreased weight shift to left Gait velocity: decreased   General Gait Details: heavy use of UEs on RW with ambulation. fatigues with self initated rest breaks   Stairs Stairs: Yes Stairs assistance: Min guard Stair Management: Two rails;Step to pattern Number of Stairs: 4 General stair comments: no dizziness today with mobilty   Wheelchair Mobility    Modified Rankin (Stroke Patients Only)       Balance Overall balance assessment: Needs assistance Sitting-balance support: Feet supported Sitting balance-Leahy Scale: Good     Standing balance support: Bilateral upper  extremity supported;During functional activity Standing balance-Leahy Scale: Good                              Cognition Arousal/Alertness: Awake/alert Behavior During Therapy: WFL for tasks assessed/performed;Flat affect Overall Cognitive Status: Within Functional Limits for tasks assessed                                        Exercises Total Joint Exercises Short Arc Quad: AAROM;Strengthening;Left;10 reps Heel Slides: AROM;10 reps;Left;Seated Goniometric ROM: 4-80 Other Exercises Other Exercises: Pt reported doing HEP prior to my arrival - no further questions    General Comments        Pertinent Vitals/Pain Pain Assessment: 0-10 Pain Score: 5  Pain Location: L knee Pain Descriptors / Indicators: Aching;Discomfort;Sore;Tightness Pain Intervention(s): Limited activity within patient's tolerance;Premedicated before session;Monitored during session;Ice applied    Home Living                      Prior Function            PT Goals (current goals can now be found in the care plan section) Progress towards PT goals: Progressing toward goals    Frequency    BID      PT Plan Current plan remains appropriate    Co-evaluation              AM-PAC PT "6 Clicks" Mobility   Outcome Measure  Help needed turning from your back to your side while in a flat bed without using bedrails?: A Little Help needed moving from lying on your back to sitting on the side of a flat bed without using bedrails?: A Little Help needed moving to and from a bed to a chair (including a wheelchair)?: A Little Help needed standing up from a chair using your arms (e.g., wheelchair or bedside chair)?: None Help needed to walk in hospital room?: A Little Help needed climbing 3-5 steps with a railing? : A Little 6 Click Score: 19    End of Session Equipment Utilized During Treatment: Gait belt Activity Tolerance: Patient tolerated treatment  well Patient left: in chair;with call bell/phone within reach Nurse Communication: Mobility status Pain - Right/Left: Left Pain - part of body: Knee     Time: [id number] PT Time Calculation (min) (ACUTE ONLY): 23 min  Charges:  $Gait Training: 8-22 mins $Therapeutic Exercise: 8-22 mins                     Chesley Noon, PTA 03/07/19, 11:35 AM

## 2019-03-07 NOTE — TOC Initial Note (Addendum)
Transition of Care [facility]) - Initial/Assessment Note    Patient Details  Name: Marcia Maldonado MRN: [id number] Date of Birth: 1970/12/17  Transition of Care [facility]) CM/SW Contact:    [name], LCSW Phone Number:[phone] 03/07/2019, 11:52 AM  Clinical Narrative:                 Pt was in a great mood sitting up and had just had a walk around the unit. She was agreeable to the [facility] recommendation and said that she has never worked with a  Brewing technologist before. CSW provided pt with the medicare.gov list for reference. Pt stated that she had no preference. CSW contacted Corene Cornea with [facility] services and he will follow up as soon as possible.  TOC will continue to follow for discharge planning needs.    Expected Discharge Plan: [place] Barriers to Discharge: Continued Medical Work up   Patient Goals and CMS Choice Patient states their goals for this hospitalization and ongoing recovery are:: to get stronger CMS Medicare.gov Compare Post Acute Care list provided to:: Patient Choice offered to / list presented to : Patient  Expected Discharge Plan and Services Expected Discharge Plan: [place]       Living arrangements for the past 2 months: Single Family Home Expected Discharge Date: 03/07/19                                    Prior Living Arrangements/Services Living arrangements for the past 2 months: Single Family Home Lives with:: Adult Children, Spouse Patient language and need for interpreter reviewed:: Yes Do you feel safe going back to the place where you live?: Yes        Care giver support system in place?: Yes (comment)   Criminal Activity/Legal Involvement Pertinent to Current Situation/Hospitalization: No - Comment as needed  Activities of Daily Living Home Assistive Devices/Equipment: Cane (specify quad or straight) ADL Screening (condition at time of admission) Patient's cognitive ability adequate to  safely complete daily activities?: Yes Is the patient deaf or have difficulty hearing?: No Does the patient have difficulty seeing, even when wearing glasses/contacts?: No Does the patient have difficulty concentrating, remembering, or making decisions?: No Patient able to express need for assistance with ADLs?: Yes Does the patient have difficulty dressing or bathing?: No Independently performs ADLs?: Yes (appropriate for developmental age) Does the patient have difficulty walking or climbing stairs?: No Weakness of Legs: Left Weakness of Arms/Hands: None  Permission Sought/Granted Permission sought to share information with : Facility Art therapist granted to share information with : Yes, Verbal Permission Granted  Share Information with NAME: [name]     Permission granted to share info w Relationship: Husband  Permission granted to share info w Contact Information: [phone]  Emotional Assessment Appearance:: Appears younger than stated age Attitude/Demeanor/Rapport: Engaged Affect (typically observed): Accepting, Calm Orientation: : Oriented to Self, Oriented to Place, Oriented to  Time, Oriented to Situation   Psych Involvement: No (comment)  Admission diagnosis:  Knee joint replacement status, left [Z96.652] Total knee replacement status [Z96.659] Patient Active Problem List   Diagnosis Date Noted  . 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 Asthma 08/25/2013  . HTN (hypertension) 08/25/2013  . Fibroids 08/11/2013   PCP:  Marguerita Merles, MD Pharmacy:   [facility] [phone] -  [place] ([place]), [place] [address] [place] ([place]) [place] [address] Phone: [phone] Fax: [place], [address] [place] [address] Phone: [phone] Fax: [phone]     Social  Determinants of Health (SDOH) Interventions    Readmission Risk Interventions No flowsheet data found.

## 2019-03-07 NOTE — Progress Notes (Signed)
Patient is being discharged home with [facility] which will start on Tuesday. DC & Rx instructions given and patient acknowledged understanding. IV removed and belongings packed. Husband will come and transport patient home.

## 2019-03-07 NOTE — Progress Notes (Signed)
Subjective: 3 Days Post-Op Procedure(s) (LRB): LEFT TOTAL KNEE ARTHROPLASTY (Left) Patient reports pain as 6 on 0-10 scale.   Denies any CP, SOB, ABD pain. Continue with PT today, current plan is for d/c home with HHPT. Plan is to go Home after hospital stay.  Objective: Vital signs in last 24 hours: Temp:  [98.4 F (36.9 C)-98.9 F (37.2 C)] 98.6 F (37 C) (02/21 0810) Pulse Rate:  [85-92] 85 (02/21 0810) Resp:  [18-20] 18 (02/21 0810) BP: (144-148)/(86-95) 148/94 (02/21 0810) SpO2:  [94 %-98 %] 95 % (02/21 0810)  Intake/Output from previous day: 02/20 0701 - 02/21 0700 In: 600 [P.O.:600] Out: 0  Intake/Output this shift: No intake/output data recorded.  Recent Labs    03/05/19 0621 03/06/19 0437 03/07/19 0614  HGB 13.0 12.9 12.5   Recent Labs    03/06/19 0437 03/07/19 0614  WBC 10.0 8.5  RBC 4.20 4.12  HCT 37.8 37.3  PLT 142* 133*   Recent Labs    03/06/19 0437 03/07/19 0614  NA 136 139  K 3.9 3.6  CL 102 102  CO2 26 29  BUN 10 11  CREATININE 1.14* 1.03*  GLUCOSE 102* 92  CALCIUM 8.4* 8.5*   No results for input(s): LABPT, INR in the last 72 hours.  EXAM General - Patient is Alert, Appropriate and Oriented Extremity - Neurovascular intact Sensation intact distally Intact pulses distally Dorsiflexion/Plantar flexion intact No cellulitis present Compartment soft Dressing - dressing C/D/I and no drainage, Praveena intact without drainage Motor Function - intact, moving foot and toes well on exam.   Past Medical History:  Diagnosis Date  . Asthma   . Hypertension   . Migraine     Assessment/Plan:   3 Days Post-Op Procedure(s) (LRB): LEFT TOTAL KNEE ARTHROPLASTY (Left) Active Problems:   Knee joint replacement status, left   Total knee replacement status  Estimated body mass index is 39.77 kg/m as calculated from the following:   Height as of this encounter: 5' 7.01" (1.702 m).   Weight as of this encounter: 115.2 kg. Advance diet Up  with therapy  Patient is passing gas without pain.  Continue to work on BM. Acute renal insufficiency, Cr 1.03 and BUN 11 today.  Continues to improve. Up with therapy today. Plan for d/c home this afternoon.  DVT Prophylaxis - Lovenox, Foot Pumps and TED hose Weight-Bearing as tolerated to left leg  J. Cameron Proud, PA-C [place] 03/07/2019, 9:04 AM

## 2019-03-10 ENCOUNTER — Emergency Department: Payer: Managed Care, Other (non HMO)

## 2019-03-10 ENCOUNTER — Encounter: Payer: Self-pay | Admitting: Emergency Medicine

## 2019-03-10 ENCOUNTER — Other Ambulatory Visit: Payer: Self-pay

## 2019-03-10 ENCOUNTER — Emergency Department
Admission: EM | Admit: 2019-03-10 | Discharge: 2019-03-10 | Disposition: A | Discharge status: Home / Self Care | Payer: Managed Care, Other (non HMO) | Attending: Emergency Medicine | Admitting: Emergency Medicine

## 2019-03-10 DIAGNOSIS — Z7901 Long term (current) use of anticoagulants: Secondary | ICD-10-CM | POA: Insufficient documentation

## 2019-03-10 DIAGNOSIS — Z79899 Other long term (current) drug therapy: Secondary | ICD-10-CM | POA: Insufficient documentation

## 2019-03-10 DIAGNOSIS — G8918 Other acute postprocedural pain: Secondary | ICD-10-CM | POA: Diagnosis not present

## 2019-03-10 DIAGNOSIS — J45909 Unspecified asthma, uncomplicated: Secondary | ICD-10-CM | POA: Diagnosis not present

## 2019-03-10 DIAGNOSIS — I10 Essential (primary) hypertension: Secondary | ICD-10-CM | POA: Diagnosis not present

## 2019-03-10 DIAGNOSIS — Z96652 Presence of left artificial knee joint: Secondary | ICD-10-CM | POA: Diagnosis not present

## 2019-03-10 DIAGNOSIS — M79605 Pain in left leg: Secondary | ICD-10-CM | POA: Diagnosis present

## 2019-03-10 LAB — CBC WITH DIFFERENTIAL/PLATELET
Abs Immature Granulocytes: 0.04 10*3/uL (ref 0.00–0.07)
Basophils Absolute: 0.1 10*3/uL (ref 0.0–0.1)
Basophils Relative: 1 %
Eosinophils Absolute: 0.5 10*3/uL (ref 0.0–0.5)
Eosinophils Relative: 7 %
HCT: 38.2 % (ref 36.0–46.0)
Hemoglobin: 13 g/dL (ref 12.0–15.0)
Immature Granulocytes: 1 %
Lymphocytes Relative: 14 %
Lymphs Abs: 1.1 10*3/uL (ref 0.7–4.0)
MCH: 30.2 pg (ref 26.0–34.0)
MCHC: 34 g/dL (ref 30.0–36.0)
MCV: 88.6 fL (ref 80.0–100.0)
Monocytes Absolute: 1 10*3/uL (ref 0.1–1.0)
Monocytes Relative: 13 %
Neutro Abs: 5.2 10*3/uL (ref 1.7–7.7)
Neutrophils Relative %: 64 %
Platelets: 192 10*3/uL (ref 150–400)
RBC: 4.31 MIL/uL (ref 3.87–5.11)
RDW: 12.1 % (ref 11.5–15.5)
WBC: 8 10*3/uL (ref 4.0–10.5)
nRBC: 0 % (ref 0.0–0.2)

## 2019-03-10 LAB — COMPREHENSIVE METABOLIC PANEL
ALT: 33 U/L (ref 0–44)
AST: 36 U/L (ref 15–41)
Albumin: 3.7 g/dL (ref 3.5–5.0)
Alkaline Phosphatase: 64 U/L (ref 38–126)
Anion gap: 9 (ref 5–15)
BUN: 12 mg/dL (ref 6–20)
CO2: 28 mmol/L (ref 22–32)
Calcium: 8.9 mg/dL (ref 8.9–10.3)
Chloride: 100 mmol/L (ref 98–111)
Creatinine, Ser: 1.03 mg/dL — ABNORMAL HIGH (ref 0.44–1.00)
GFR calc Af Amer: >60 mL/min (ref >60)
GFR calc non Af Amer: >60 mL/min (ref >60)
Glucose, Bld: 104 mg/dL — ABNORMAL HIGH (ref 70–99)
Potassium: 3.4 mmol/L — ABNORMAL LOW (ref 3.5–5.1)
Sodium: 137 mmol/L (ref 135–145)
Total Bilirubin: 1.5 mg/dL — ABNORMAL HIGH (ref 0.3–1.2)
Total Protein: 7.9 g/dL (ref 6.5–8.1)

## 2019-03-10 LAB — PROTIME-INR
INR: 1 (ref 0.8–1.2)
Prothrombin Time: 12.9 seconds (ref 11.4–15.2)

## 2019-03-10 LAB — LACTIC ACID, PLASMA: Lactic Acid, Venous: 1.4 mmol/L (ref 0.5–1.9)

## 2019-03-10 MED ORDER — OXYCODONE-ACETAMINOPHEN 5-325 MG PO TABS: 1.0000 | ORAL_TABLET | Freq: Four times a day (QID) | ORAL | 0 refills | Status: AC | PRN

## 2019-03-10 MED ORDER — ONDANSETRON HCL 4 MG/2ML IJ SOLN
4.0000 mg | Freq: Once | INTRAMUSCULAR | Status: AC
  Administered 2019-03-10: 4 mg via INTRAVENOUS
  Filled 2019-03-10: qty 2

## 2019-03-10 MED ORDER — LIDOCAINE 5 % EX PTCH
1.0000 | MEDICATED_PATCH | CUTANEOUS | Status: DC
  Administered 2019-03-10: 1 via TRANSDERMAL
  Filled 2019-03-10: qty 1

## 2019-03-10 MED ORDER — SODIUM CHLORIDE 0.9 % IV BOLUS
1000.0000 mL | Freq: Once | INTRAVENOUS | Status: AC
  Administered 2019-03-10: 1000 mL via INTRAVENOUS

## 2019-03-10 MED ORDER — HYDROMORPHONE HCL 1 MG/ML IJ SOLN
1.0000 mg | Freq: Once | INTRAMUSCULAR | Status: AC
  Administered 2019-03-10: 1 mg via INTRAVENOUS
  Filled 2019-03-10: qty 1

## 2019-03-10 NOTE — ED Triage Notes (Addendum)
Pt from home via [facility]. Per EMS, pt had left knee surgery on 2/18, sent home with woundvac. Pt reports increased swelling/pain/pressure on left knee since Tuesday, st wound vac malfunction.  Denies SHOB/CP at this time. Swelling/redness noted upon arrival. Roderic Palau PA at bedside upon arrival.

## 2019-03-10 NOTE — ED Provider Notes (Signed)
[facility] Emergency Department Provider Note  ____________________________________________  Time seen: Approximately 4:22 PM  I have reviewed the triage vital signs and the nursing notes.   HISTORY  Chief Complaint Post-op Problem    HPI Marcia Maldonado is a 49 y.o. female who presents the emergency department via EMS from home for complaint of increasing knee pain, edema, erythema.  Patient had  a total knee replacement 6 days ago on the 18th.  This was performed by Dr. Rudene Christians with [facility].  Patient states that she had done very well after her surgery.  She states that she was able to perform her physical therapy, have limited amounts of pain.  Patient states that she has developed a "heavy feeling" in her lower leg.  This is accompanied by increased edema and erythema about the knee and the left calf.  Patient states that the majority of her pain is in her left calf, not her knee.  No thigh or hip pain.  Patient denies any back pain.  Patient did have a low-grade temperature today of 100.2 F.  No URI symptoms or nasal congestion, sore throat, cough.  Patient denies any chest pain or shortness of breath.  No pleuritic pain.  Patient contacted her orthopedic surgeon and was advised to present to the emergency department for evaluation.        Past Medical History:  Diagnosis Date  . Asthma   . Hypertension   . Migraine     Patient Active Problem List   Diagnosis Date Noted  . 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 Asthma 08/25/2013  . HTN (hypertension) 08/25/2013  . Fibroids 08/11/2013    Past Surgical History:  Procedure Laterality Date  . ABDOMINAL HYSTERECTOMY    . APPENDECTOMY    . BREAST BIOPSY Left    neg  . CHOLECYSTECTOMY    . ECTOPIC PREGNANCY SURGERY    . TOTAL KNEE ARTHROPLASTY Left 03/04/2019   Procedure: LEFT TOTAL KNEE ARTHROPLASTY;  Surgeon: Hessie Knows, MD;  Location:  [facility] ORS;  Service: Orthopedics;  Laterality: Left;    Prior to Admission medications   Medication Sig Start Date End Date Taking? Authorizing Provider  albuterol (PROVENTIL HFA;VENTOLIN HFA) 108 (90 BASE) MCG/ACT inhaler Inhale 2 puffs into the lungs every 6 (six) hours as needed for shortness of breath. For SOB      [provider]  amLODipine (NORVASC) 10 MG tablet Take 10 mg by mouth daily.    [provider]  enoxaparin (LOVENOX) 40 MG/0.4ML injection Inject 0.4 mLs (40 mg total) into the skin daily for 14 days. 03/05/19 03/19/19  Duanne Guess, PA-C  hydrochlorothiazide (HYDRODIURIL) 25 MG tablet Take 12.5 mg by mouth daily.     [provider]  HYDROcodone-acetaminophen (NORCO) 7.5-325 MG tablet Take 1-2 tablets by mouth every 6 (six) hours as needed for moderate pain or severe pain. 03/05/19   Duanne Guess, PA-C  labetalol (NORMODYNE) 200 MG tablet Take 200 mg by mouth 2 (two) times daily.     [provider]  ondansetron (ZOFRAN) 4 MG tablet Take 1 tablet (4 mg total) by mouth every 6 (six) hours as needed for nausea. 03/05/19   Duanne Guess, PA-C  ondansetron (ZOFRAN-ODT) 4 MG disintegrating tablet Take 1 tablet (4 mg total) by mouth every 8 (eight) hours as needed for nausea or vomiting. Patient not taking: Reported on 02/10/2019 01/08/18   Jacqulyn Cane,  MD  oxyCODONE-acetaminophen (PERCOCET/ROXICET) 5-325 MG tablet Take 1 tablet by mouth every 6 (six) hours as needed for severe pain. 03/10/19   Rinnah Peppel, Charline Bills, PA-C  promethazine-codeine (PHENERGAN WITH CODEINE) 6.25-10 MG/5ML syrup Take 1-2 teaspoons every 6 hours as needed for severe cough. May cause drowsiness. Patient not taking: Reported on 02/10/2019 01/08/18   Jacqulyn Cane, MD  SUMAtriptan (IMITREX) 25 MG tablet Take 25 mg by mouth every 2 (two) hours as needed for migraine. May repeat in 2 hours if headache persists or recurs.    [provider]  topiramate (TOPAMAX) 50 MG  tablet Take 50 mg by mouth daily.    [provider]  traMADol (ULTRAM) 50 MG tablet Take 1-2 tablets (50-100 mg total) by mouth every 6 (six) hours. 03/07/19   Lattie Corns, PA-C    Allergies Erythromycin, Phenylephrine-guaifenesin, Guaifenesin, and Guaifenesin & derivatives  Family History  Problem Relation Age of Onset  . Breast cancer Mother 71    Social History Social History   Tobacco Use  . Smoking status: Never Smoker  . Smokeless tobacco: Never Used  Substance Use Topics  . Alcohol use: Yes  . Drug use: No     Review of Systems  Constitutional: No fever/chills Eyes: No visual changes. No discharge ENT: No upper respiratory complaints. Cardiovascular: no chest pain. Respiratory: no cough. No SOB. Gastrointestinal: No abdominal pain.  No nausea, no vomiting.  Musculoskeletal: Positive for increasing left leg pain, erythema, edema following total knee replacement 6 days ago Skin: Negative for rash, abrasions, lacerations, ecchymosis. Neurological: Negative for headaches, focal weakness or numbness. 10-point ROS otherwise negative.  ____________________________________________   PHYSICAL EXAM:  VITAL SIGNS: ED Triage Vitals  Enc Vitals Group     BP      Pulse      Resp      Temp      Temp src      SpO2      Weight      Height      Head Circumference      Peak Flow      Pain Score      Pain Loc      Pain Edu?      Excl. in Blue?      Constitutional: Alert and oriented. Well appearing and in no acute distress. Eyes: Conjunctivae are normal. PERRL. EOMI. Head: Atraumatic. ENT:      Ears:       Nose: No congestion/rhinnorhea.      Mouth/Throat: Mucous membranes are moist.  Neck: No stridor.    Cardiovascular: Normal rate, regular rhythm. Normal S1 and S2.  Good peripheral circulation. Respiratory: Normal respiratory effort without tachypnea or retractions. Lungs CTAB. Good air entry to the bases with no decreased or absent breath  sounds. Musculoskeletal: Full range of motion to all extremities. No gross deformities appreciated.  Visualization of the left lower extremity revealed edema, erythema extending from the distal femur into the calf region.  Patient does have a wound VAC in place.  No skin breakdown identified.  Wound VAC is covering surgical site and this is not removed to evaluate for dehiscence.  No purulent drainage from around the wound VAC.  Patient is tender to palpation along the medial aspect of the knee extending into the left calf.  Well palpable edema is appreciated about the left knee, no fluctuance or induration is appreciated.  Knee is warm when compared with unaffected knee.  Patient does have tenderness to  palpation again along the medial aspect of the knee, calf.  No other tenderness to palpation.  Examination of the hip, ankle is unremarkable.  Dorsalis pedis pulse intact distally.  Sensation intact distally. Neurologic:  Normal speech and language. No gross focal neurologic deficits are appreciated.  Skin:  Skin is warm, dry and intact. No rash noted. Psychiatric: Mood and affect are normal. Speech and behavior are normal. Patient exhibits appropriate insight and judgement.   ____________________________________________   LABS (all labs ordered are listed, but only abnormal results are displayed)  Labs Reviewed  COMPREHENSIVE METABOLIC PANEL - Abnormal; Notable for the following components:      Result Value   Potassium 3.4 (*)    Glucose, Bld 104 (*)    Creatinine, Ser 1.03 (*)    Total Bilirubin 1.5 (*)    All other components within normal limits  CULTURE, BLOOD (ROUTINE X 2)  CULTURE, BLOOD (ROUTINE X 2)  LACTIC ACID, PLASMA  CBC WITH DIFFERENTIAL/PLATELET  PROTIME-INR  LACTIC ACID, PLASMA   ____________________________________________  EKG   ____________________________________________  RADIOLOGY I personally viewed and evaluated these images as part of my medical decision  making, as well as reviewing the written report by the radiologist.  US Venous Img Lower Unilateral Left  Result Date: 03/10/2019 CLINICAL DATA:  Increasing leg pain and erythema following total knee arthroplasty 6 days ago. EXAM: LEFT LOWER EXTREMITY VENOUS DOPPLER ULTRASOUND TECHNIQUE: Gray-scale sonography with compression, as well as color and duplex ultrasound, were performed to evaluate the deep venous system(s) from the level of the common femoral vein through the popliteal and proximal calf veins. COMPARISON:  Radiographs 03/10/2019.  Doppler ultrasound 05/16/2017. FINDINGS: VENOUS Normal compressibility of the common femoral, superficial femoral, and popliteal veins, as well as the visualized calf veins. Visualized portions of profunda femoral vein and great saphenous vein unremarkable. No filling defects to suggest DVT on grayscale or color Doppler imaging. Doppler waveforms show normal direction of venous flow, normal respiratory phasicity and response to augmentation. Limited views of the contralateral common femoral vein are unremarkable. OTHER None. Limitations: none IMPRESSION: No femoropopliteal DVT nor evidence of DVT within the visualized calf veins. If clinical symptoms are inconsistent or if there are persistent or worsening symptoms, further imaging (possibly involving the iliac veins) may be warranted. Electronically Signed   By: Richardean Sale M.D.   On: 03/10/2019 18:31   DG Knee Complete 4 Views Left  Result Date: 03/10/2019 CLINICAL DATA:  Increasing knee pain after total knee replacement 6 days ago. EXAM: LEFT KNEE - COMPLETE 4+ VIEW COMPARISON:  03/04/2019 FINDINGS: Two views study shows no fracture. No subluxation or dislocation. Patient is status post tricompartmental knee replacement. Subcutaneous edema evident in there is probably an effusion in the suprapatellar bursa. Potential tiny gas bubble in the suprapatellar bursa although this would not be unexpected 6 days after  surgery. IMPRESSION: 1. No acute bony abnormality. 2. Joint effusion in the suprapatellar bursa with probable tiny gas bubble in the bursa, not unexpected 6 days after surgery Electronically Signed   By: Misty Stanley M.D.   On: 03/10/2019 17:26    ____________________________________________    PROCEDURES  Procedure(s) performed:    Procedures    Medications  lidocaine (LIDODERM) 5 % 1 patch (1 patch Transdermal Patch Applied 03/10/19 1919)  sodium chloride 0.9 % bolus 1,000 mL (0 mLs Intravenous Stopped 03/10/19 1749)  HYDROmorphone (DILAUDID) injection 1 mg (1 mg Intravenous Given 03/10/19 1919)  ondansetron (ZOFRAN) injection 4 mg (4  mg Intravenous Given 03/10/19 1918)     ____________________________________________   INITIAL IMPRESSION / ASSESSMENT AND PLAN / ED COURSE  Pertinent labs & imaging results that were available during my care of the patient were reviewed by me and considered in my medical decision making (see chart for details).  Review of the [place] CSRS was performed in accordance of the [place] prior to dispensing any controlled drugs.  Clinical Course as of Mar 09 2329  Wed Mar 10, 2019  1635 Patient presented to the emergency department via EMS from home for complaint of increasing left knee pain, edema and erythema.  Patient is status post total knee replacement 6 days ago.  Patient had been sent to emergency department for evaluation for possible DVT.  On arrival, patient had a low-grade temperature of 100.2 F.  Joint is edematous, erythematous when compared with unaffected extremity.  While this is not totally unexpected given recent surgery, patient does have warmth to the joint as well as tenderness into the calf.  At this time patient will be evaluated with labs, imaging.  Differential includes DVT, cellulitis, septic joint.   [JC]    Clinical Course User Index [JC] Celsa Nordahl, Charline Bills, PA-C          Patient's diagnosis is consistent with postoperative  pain.  Patient presented to the emergency department complaining of increased pain, erythema and edema to the left knee.  Patient had been referred to the emergency department for evaluation for possible DVT.  On exam, knee was slightly erythematous, edematous as well as tender to palpation along the medial aspect of the knee extending into the calf.  Patient was evaluated with x-ray, ultrasound, labs.  Labs are reassuring at this time.  No evidence of infection on exam or labs.  X-ray reveals no osseous abnormality following total knee replacement.  Ultrasound reveals no evidence of DVT.  I did discuss the case with on-call orthopedic surgeon, Dr. Marry Guan.  At this time, no further recommendations.  Patient was given pain medication emergency department which did help.  I discussed with the patient about using at home medications versus pain control admission.  Patient requesting to be discharged which I feel is reasonable.  Return precautions are discussed with patient..  Patient is given ED precautions to return to the ED for any worsening or new symptoms.     ____________________________________________  FINAL CLINICAL IMPRESSION(S) / ED DIAGNOSES  Final diagnoses:  Post-operative pain      NEW MEDICATIONS STARTED DURING THIS VISIT:  ED Discharge Orders         Ordered    oxyCODONE-acetaminophen (PERCOCET/ROXICET) 5-325 MG tablet  Every 6 hours PRN     03/10/19 2008              This chart was dictated using voice recognition software/Dragon. Despite best efforts to proofread, errors can occur which can change the meaning. Any change was purely unintentional.    Darletta Moll, PA-C 03/10/19 2331    Delman Kitten, MD 03/10/19 [phone]

## 2019-03-15 LAB — CULTURE, BLOOD (ROUTINE X 2)
Culture: NO GROWTH
Culture: NO GROWTH
Special Requests: ADEQUATE
Special Requests: ADEQUATE

## 2019-03-29 ENCOUNTER — Ambulatory Visit: Payer: Self-pay | Admitting: Dermatology

## 2019-06-25 ENCOUNTER — Emergency Department: Payer: Managed Care, Other (non HMO)

## 2019-06-25 ENCOUNTER — Emergency Department
Admission: EM | Admit: 2019-06-25 | Discharge: 2019-06-25 | Disposition: A | Discharge status: Home / Self Care | Payer: Managed Care, Other (non HMO) | Attending: Emergency Medicine | Admitting: Emergency Medicine

## 2019-06-25 DIAGNOSIS — M5412 Radiculopathy, cervical region: Secondary | ICD-10-CM | POA: Insufficient documentation

## 2019-06-25 DIAGNOSIS — M62838 Other muscle spasm: Secondary | ICD-10-CM | POA: Insufficient documentation

## 2019-06-25 DIAGNOSIS — Z96652 Presence of left artificial knee joint: Secondary | ICD-10-CM | POA: Insufficient documentation

## 2019-06-25 DIAGNOSIS — J45909 Unspecified asthma, uncomplicated: Secondary | ICD-10-CM | POA: Diagnosis not present

## 2019-06-25 DIAGNOSIS — I10 Essential (primary) hypertension: Secondary | ICD-10-CM | POA: Insufficient documentation

## 2019-06-25 DIAGNOSIS — M542 Cervicalgia: Secondary | ICD-10-CM | POA: Diagnosis present

## 2019-06-25 MED ORDER — METHOCARBAMOL 500 MG PO TABS: 500.0000 mg | ORAL_TABLET | Freq: Three times a day (TID) | ORAL | 0 refills | Status: AC | PRN

## 2019-06-25 MED ORDER — PREDNISONE 10 MG (21) PO TBPK: ORAL_TABLET | ORAL | 0 refills | Status: AC

## 2019-06-25 NOTE — ED Notes (Signed)
Pt c/o "severe neck pain starting at base of skull and shooting down". Pt states pain started on Wednesday. Pt states "it's like someone stabbed with a needle in the back of my head". Pt states pain worse with movement at this time.

## 2019-06-25 NOTE — ED Notes (Signed)
No peripheral IV placed this visit.    Discharge instructions reviewed with patient. Questions fielded by this RN. Patient verbalizes understanding of instructions. Patient discharged home in stable condition per provider. No acute distress noted at time of discharge.    

## 2019-06-25 NOTE — ED Provider Notes (Signed)
Emergency Department Provider Note  ____________________________________________  Time seen: Approximately 4:35 PM  I have reviewed the triage vital signs and the nursing notes.   HISTORY  Chief Complaint Neck Pain   Historian Patient    HPI Marcia Maldonado is a 49 y.o. female presents to the emergency department with neck pain and tingling that radiates into the bilateral hands.  Patient states that she has a recent history of lifting heavy furniture.  Several days ago, she states that she felt like she had a crick in her neck.  She went to reach for an item and felt a sharp midline pain close to the back of her head.  No weakness of the upper extremities.  No similar injuries in the past.   Past Medical History:  Diagnosis Date  . Asthma   . Hypertension   . Migraine      Immunizations up to date:  Yes.     Past Medical History:  Diagnosis Date  . Asthma   . Hypertension   . Migraine     Patient Active Problem List   Diagnosis Date Noted  . 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 Asthma 08/25/2013  . HTN (hypertension) 08/25/2013  . Fibroids 08/11/2013    Past Surgical History:  Procedure Laterality Date  . ABDOMINAL HYSTERECTOMY    . APPENDECTOMY    . BREAST BIOPSY Left    neg  . CHOLECYSTECTOMY    . ECTOPIC PREGNANCY SURGERY    . TOTAL KNEE ARTHROPLASTY Left 03/04/2019   Procedure: LEFT TOTAL KNEE ARTHROPLASTY;  Surgeon: Hessie Knows, MD;  Location: [facility] ORS;  Service: Orthopedics;  Laterality: Left;    Prior to Admission medications   Medication Sig Start Date End Date Taking? Authorizing Provider  albuterol (PROVENTIL HFA;VENTOLIN HFA) 108 (90 BASE) MCG/ACT inhaler Inhale 2 puffs into the lungs every 6 (six) hours as needed for shortness of breath. For SOB      [provider]  amLODipine (NORVASC) 10 MG tablet Take 10 mg by mouth daily.    [provider]  enoxaparin  (LOVENOX) 40 MG/0.4ML injection Inject 0.4 mLs (40 mg total) into the skin daily for 14 days. 03/05/19 03/19/19  Duanne Guess, PA-C  hydrochlorothiazide (HYDRODIURIL) 25 MG tablet Take 12.5 mg by mouth daily.     [provider]  HYDROcodone-acetaminophen (NORCO) 7.5-325 MG tablet Take 1-2 tablets by mouth every 6 (six) hours as needed for moderate pain or severe pain. 03/05/19   Duanne Guess, PA-C  labetalol (NORMODYNE) 200 MG tablet Take 200 mg by mouth 2 (two) times daily.     [provider]  methocarbamol (ROBAXIN) 500 MG tablet Take 1 tablet (500 mg total) by mouth every 8 (eight) hours as needed for up to 5 days. 06/25/19 06/30/19  Lannie Fields, PA-C  ondansetron (ZOFRAN) 4 MG tablet Take 1 tablet (4 mg total) by mouth every 6 (six) hours as needed for nausea. 03/05/19   Duanne Guess, PA-C  ondansetron (ZOFRAN-ODT) 4 MG disintegrating tablet Take 1 tablet (4 mg total) by mouth every 8 (eight) hours as needed for nausea or vomiting. Patient not taking: Reported on 02/10/2019 01/08/18   Jacqulyn Cane, MD  oxyCODONE-acetaminophen (PERCOCET/ROXICET) 5-325 MG tablet Take 1 tablet by mouth every 6 (six) hours as needed for severe pain. 03/10/19   Cuthriell, Charline Bills, PA-C  predniSONE (STERAPRED UNI-PAK 21 TAB) 10 MG (21) TBPK tablet Take 6  tablets the first day, take 5 tablets the second day, take 4 tablets the third day, take 3 tablets the fourth day, take 2 tablets the fifth day, take 1 tablet the sixth day. 06/25/19   Lannie Fields, PA-C  promethazine-codeine (PHENERGAN WITH CODEINE) 6.25-10 MG/5ML syrup Take 1-2 teaspoons every 6 hours as needed for severe cough. May cause drowsiness. Patient not taking: Reported on 02/10/2019 01/08/18   Jacqulyn Cane, MD  SUMAtriptan (IMITREX) 25 MG tablet Take 25 mg by mouth every 2 (two) hours as needed for migraine. May repeat in 2 hours if headache persists or recurs.    [provider]  topiramate (TOPAMAX) 50 MG tablet Take  50 mg by mouth daily.    [provider]  traMADol (ULTRAM) 50 MG tablet Take 1-2 tablets (50-100 mg total) by mouth every 6 (six) hours. 03/07/19   Lattie Corns, PA-C    Allergies Erythromycin, Phenylephrine-guaifenesin, Guaifenesin, and Guaifenesin & derivatives  Family History  Problem Relation Age of Onset  . Breast cancer Mother 47    Social History Social History   Tobacco Use  . Smoking status: Never Smoker  . Smokeless tobacco: Never Used  Vaping Use  . Vaping Use: Never used  Substance Use Topics  . Alcohol use: Yes  . Drug use: No     Review of Systems  Constitutional: No fever/chills Eyes:  No discharge ENT: No upper respiratory complaints. Respiratory: no cough. No SOB/ use of accessory muscles to breath Gastrointestinal:   No nausea, no vomiting.  No diarrhea.  No constipation. Musculoskeletal: Patient has neck pain. Skin: Negative for rash, abrasions, lacerations, ecchymosis.    ____________________________________________   PHYSICAL EXAM:  VITAL SIGNS: ED Triage Vitals  Enc Vitals Group     BP 06/25/19 1522 (!) 160/126     Pulse Rate 06/25/19 1522 86     Resp 06/25/19 1522 18     Temp 06/25/19 1522 99.3 F (37.4 C)     Temp Source 06/25/19 1522 Oral     SpO2 06/25/19 1522 99 %     Weight 06/25/19 1540 248 lb (112.5 kg)     Height 06/25/19 1540 5\' 7"  (1.702 m)     Head Circumference --      Peak Flow --      Pain Score 06/25/19 1540 7     Pain Loc --      Pain Edu? --      Excl. in [place]? --      Constitutional: Alert and oriented. Well appearing and in no acute distress. Eyes: Conjunctivae are normal. PERRL. EOMI. Head: Atraumatic. ENT:      Nose: No congestion/rhinnorhea.      Mouth/Throat: Mucous membranes are moist.  Neck: Patient performs limited range of motion at the neck.  She does have midline tenderness along the superior aspect of the cervical spine. Hematological/Lymphatic/Immunilogical: No cervical  lymphadenopathy. Cardiovascular: Normal rate, regular rhythm. Normal S1 and S2.  Good peripheral circulation. Respiratory: Normal respiratory effort without tachypnea or retractions. Lungs CTAB. Good air entry to the bases with no decreased or absent breath sounds Musculoskeletal: Full range of motion to all extremities. No obvious deformities noted Neurologic:  Normal for age. No gross focal neurologic deficits are appreciated.  Skin:  Skin is warm, dry and intact. No rash noted. Psychiatric: Mood and affect are normal for age. Speech and behavior are normal.   ____________________________________________   LABS (all labs ordered are listed, but only abnormal results are  displayed)  Labs Reviewed - No data to display ____________________________________________  EKG   ____________________________________________  RADIOLOGY Unk Pinto, personally viewed and evaluated these images (plain radiographs) as part of my medical decision making, as well as reviewing the written report by the radiologist.    CT Cervical Spine Wo Contrast  Result Date: 06/25/2019 CLINICAL DATA:  Cervicalgia with right upper extremity radicular symptoms EXAM: CT CERVICAL SPINE WITHOUT CONTRAST TECHNIQUE: Multidetector CT imaging of the cervical spine was performed without intravenous contrast. Multiplanar CT image reconstructions were also generated. COMPARISON:  None. FINDINGS: Alignment: There is no appreciable spondylolisthesis. Skull base and vertebrae: Skull base and craniocervical junction regions appear normal. Bones appear somewhat osteoporotic. No fracture evident. No blastic or lytic bone lesions. Soft tissues and spinal canal: Prevertebral soft tissues and predental space regions are normal. There is no cord or canal hematoma. No paraspinous lesions are evident. Disc levels: There is moderate disc space narrowing at C6-7. There is mild disc space narrowing at C5-6. Other disc spaces appear  unremarkable. At C2-3, there is no appreciable facet arthropathy. No nerve root edema or effacement. No disc extrusion or stenosis. At C3-4, there is no appreciable facet arthropathy. There is no nerve root edema or effacement. No disc extrusion or stenosis. At C4-5, there is no appreciable nerve root edema or effacement. No disc extrusion or stenosis. There is no appreciable facet arthropathy. At C5-6, there is mild facet hypertrophy bilaterally. There is slight exit foraminal narrowing on the left due to bony hypertrophy. No disc extrusion or stenosis evident. At C6-7, there is relatively mild facet osteoarthritic change bilaterally. No nerve root edema or effacement. No disc extrusion or stenosis. At C7-T1, no nerve root edema or effacement. No disc extrusion or stenosis. Upper chest: Visualized upper lung regions are clear. Other: None IMPRESSION: There is exit foraminal narrowing due to bony hypertrophy on the left at C5-6. No frank disc extrusion or stenosis is evident on this study. There is moderate disc space narrowing at C6-7. No fracture or spondylolisthesis evident. Electronically Signed   By: Lowella Grip III M.D.   On: 06/25/2019 17:08    ____________________________________________    PROCEDURES  Procedure(s) performed:     Procedures     Medications - No data to display   ____________________________________________   INITIAL IMPRESSION / ASSESSMENT AND PLAN / ED COURSE  Pertinent labs & imaging results that were available during my care of the patient were reviewed by me and considered in my medical decision making (see chart for details).    Assessment and Plan:  Neck pain 49 year old female presents to the emergency department with neck pain that started after patient engaged in some heavy lifting.  She has had tingling in the bilateral hands.  Patient was hypertensive at triage vital signs were otherwise reassuring.  She had symmetric grip strength and had  full range of motion of the major joints of the bilateral upper extremities.  There is some foraminal narrowing at C5-C6 on CT cervical spine but no acute fracture.  Patient was treated for cervical radiculopathy with tapered prednisone and a muscle relaxer.  She was given a follow-up referral to neurosurgery if her symptoms do not improve or worsen.  All patient questions were answered.   ____________________________________________  FINAL CLINICAL IMPRESSION(S) / ED DIAGNOSES  Final diagnoses:  Cervical radiculopathy  Muscle spasm      NEW MEDICATIONS STARTED DURING THIS VISIT:  ED Discharge Orders  Ordered    methocarbamol (ROBAXIN) 500 MG tablet  Every 8 hours PRN     Discontinue  Reprint     06/25/19 1838    predniSONE (STERAPRED UNI-PAK 21 TAB) 10 MG (21) TBPK tablet     Discontinue  Reprint     06/25/19 1838              This chart was dictated using voice recognition software/Dragon. Despite best efforts to proofread, errors can occur which can change the meaning. Any change was purely unintentional.     Karren Cobble 06/25/19 1913    Nance Pear, MD 06/25/19 [phone]

## 2019-06-25 NOTE — Discharge Instructions (Signed)
Please take Robaxin, particularly at night to help with muscle spasms along the upper neck. Please take prednisone as directed.  You have been prescribed a taper which means that she start with 6 pills daily and decrease by 1 every day until medication runs out. Return to the emergency department with new or worsening symptoms. You have been given a follow-up with neurosurgery if needed.

## 2019-06-25 NOTE — ED Triage Notes (Signed)
Pt states that she helped move a lot of furniture a week ago and then re-arranged furniture, states that she didn't notice an injury at the time, states that Wednesday pm she started having a sharp pain at the top of her spine that radiates intermittently into her right arm and shoulder, states that her bilat arms feel tingly at times too and is made worse with movement

## 2019-08-27 ENCOUNTER — Other Ambulatory Visit: Payer: Self-pay

## 2019-08-27 ENCOUNTER — Emergency Department
Admission: EM | Admit: 2019-08-27 | Discharge: 2019-08-27 | Disposition: A | Discharge status: Home / Self Care | Payer: Managed Care, Other (non HMO) | Attending: Emergency Medicine | Admitting: Emergency Medicine

## 2019-08-27 DIAGNOSIS — Z5321 Procedure and treatment not carried out due to patient leaving prior to being seen by health care provider: Secondary | ICD-10-CM | POA: Diagnosis not present

## 2019-08-27 DIAGNOSIS — G43909 Migraine, unspecified, not intractable, without status migrainosus: Secondary | ICD-10-CM | POA: Insufficient documentation

## 2019-08-27 LAB — COMPREHENSIVE METABOLIC PANEL
ALT: 25 U/L (ref 0–44)
AST: 40 U/L (ref 15–41)
Albumin: 3.9 g/dL (ref 3.5–5.0)
Alkaline Phosphatase: 61 U/L (ref 38–126)
Anion gap: 10 (ref 5–15)
BUN: 14 mg/dL (ref 6–20)
CO2: 24 mmol/L (ref 22–32)
Calcium: 8.7 mg/dL — ABNORMAL LOW (ref 8.9–10.3)
Chloride: 104 mmol/L (ref 98–111)
Creatinine, Ser: 0.93 mg/dL (ref 0.44–1.00)
GFR calc Af Amer: >60 mL/min (ref >60)
GFR calc non Af Amer: >60 mL/min (ref >60)
Glucose, Bld: 83 mg/dL (ref 70–99)
Potassium: 4.1 mmol/L (ref 3.5–5.1)
Sodium: 138 mmol/L (ref 135–145)
Total Bilirubin: 1.7 mg/dL — ABNORMAL HIGH (ref 0.3–1.2)
Total Protein: 7.4 g/dL (ref 6.5–8.1)

## 2019-08-27 LAB — CBC
HCT: 46.6 % — ABNORMAL HIGH (ref 36.0–46.0)
Hemoglobin: 15.7 g/dL — ABNORMAL HIGH (ref 12.0–15.0)
MCH: 30.8 pg (ref 26.0–34.0)
MCHC: 33.7 g/dL (ref 30.0–36.0)
MCV: 91.4 fL (ref 80.0–100.0)
Platelets: 140 10*3/uL — ABNORMAL LOW (ref 150–400)
RBC: 5.1 MIL/uL (ref 3.87–5.11)
RDW: 13.1 % (ref 11.5–15.5)
WBC: 3.9 10*3/uL — ABNORMAL LOW (ref 4.0–10.5)
nRBC: 0 % (ref 0.0–0.2)

## 2019-08-27 NOTE — ED Triage Notes (Signed)
Pt comes POV for migraine. Hx of the same. Light sensitivity. Recently migraines went away but has started coming back. Has been taking all of her meds. Has had BC powder recently. This one started Wednesday.

## 2019-08-31 ENCOUNTER — Encounter: Payer: Self-pay | Admitting: Dermatology

## 2019-08-31 ENCOUNTER — Ambulatory Visit (INDEPENDENT_AMBULATORY_CARE_PROVIDER_SITE_OTHER): Payer: Managed Care, Other (non HMO) | Admitting: Dermatology

## 2019-08-31 ENCOUNTER — Other Ambulatory Visit: Payer: Self-pay

## 2019-08-31 DIAGNOSIS — L700 Acne vulgaris: Secondary | ICD-10-CM

## 2019-08-31 DIAGNOSIS — L639 Alopecia areata, unspecified: Secondary | ICD-10-CM

## 2019-08-31 DIAGNOSIS — L810 Postinflammatory hyperpigmentation: Secondary | ICD-10-CM | POA: Diagnosis not present

## 2019-08-31 MED ORDER — AMBULATORY NON FORMULARY MEDICATION: Status: DC

## 2019-08-31 MED ORDER — ADAPALENE 0.3 % EX GEL: CUTANEOUS | 2 refills | Status: DC

## 2019-08-31 MED ORDER — DOXYCYCLINE HYCLATE 20 MG PO TABS: 20.0000 mg | ORAL_TABLET | Freq: Two times a day (BID) | ORAL | 2 refills | Status: AC

## 2019-08-31 MED ORDER — CLINDAMYCIN PHOS-BENZOYL PEROX 1.2-5 % EX GEL: CUTANEOUS | 2 refills | Status: AC

## 2019-08-31 MED ORDER — DOXYCYCLINE HYCLATE 100 MG PO TABS: 100.0000 mg | ORAL_TABLET | Freq: Two times a day (BID) | ORAL | 0 refills | Status: AC

## 2019-08-31 MED ORDER — CLOBETASOL PROPIONATE 0.05 % EX SOLN: 1.0000 "application " | Freq: Two times a day (BID) | CUTANEOUS | 2 refills | Status: DC

## 2019-08-31 NOTE — Progress Notes (Signed)
Follow-Up Visit   Subjective  Marcia Maldonado is a 49 y.o. female who presents for the following: Acne (Pt here to discuss acne flare, pt reports acne flare worse since she started wearing a mask) and hairloss (check scalp for hair loss; pt treating scalp with Mometasone solution with a fair response).  The following portions of the chart were reviewed this encounter and updated as appropriate:  Tobacco  Allergies  Meds  Problems  Med Hx  Surg Hx  Fam Hx      Review of Systems:  No other skin or systemic complaints except as noted in HPI or Assessment and Plan.  Objective  Well appearing patient in no apparent distress; mood and affect are within normal limits.  A focused examination was performed including face, scalp, chest, back. Relevant physical exam findings are noted in the Assessment and Plan.  Objective  Head - Anterior (Face): Multiple deep inflammatory papules at cheeks, jaw, trace open comedones  Back and chest with few small inflammatory papules   Objective  scalp: Round, patchy areas of nonscarring hair loss with some regrowth  Objective  Head - Anterior (Face): Hyperpigmented macules   Assessment & Plan  Acne vulgaris Head - Anterior (Face)  Chronic and flared. Start Doxycycline 100mg  take 1 tablet po bid with food x 2 weeks then stop, then decrease to  Doxycycline 20 mg take 1 tablet bid with food Start BPO/Clindamycin apply to face qam  Start Adapalene 0.3% gel apply to face qhs   Doxycycline should be taken with food to prevent nausea. Do not lay down for 30 minutes after taking. Be cautious with sun exposure and use good sun protection while on this medication. Pregnant women should not take this medication.   Topical retinoid medications like tretinoin/Retin-A, adapalene/Differin, tazarotene/Fabior, and Epiduo/Epiduo Forte can cause dryness and irritation when first started. Only apply a pea-sized amount to the entire affected area. Avoid  applying it around the eyes, edges of mouth and creases at the nose. If you experience irritation, use a good moisturizer first and/or apply the medicine less often. If you are doing well with the medicine, you can increase how often you use it until you are applying every night. Be careful with sun protection while using this medication as it can make you sensitive to the sun. This medicine should not be used by pregnant women.   Benzoyl peroxide can cause dryness and irritation of the skin. It can also bleach fabric. When used together with Aczone (dapsone) cream, it can stain the skin orange.   Ordered Medications: AMBULATORY NON FORMULARY MEDICATION doxycycline (VIBRA-TABS) 100 MG tablet doxycycline (PERIOSTAT) 20 MG tablet Adapalene (DIFFERIN) 0.3 % gel Clindamycin-Benzoyl Per, Refr, (DUAC) gel  Alopecia areata scalp  We will inject with Kenalog 2 mg   Start Clobetasol 0.05% solution apply to scalp bid prn avoid f/g/a  D/C Mometasone solutioin   Topical steroids (such as triamcinolone, fluocinolone, fluocinonide, mometasone, clobetasol, halobetasol, betamethasone, hydrocortisone) can cause thinning and lightening of the skin if they are used for too long in the same area. Your physician has selected the right strength medicine for your problem and area affected on the body. Please use your medication only as directed by your physician to prevent side effects.    Intralesional injection - scalp Location: scalp   Informed Consent: Discussed risks (infection, pain, bleeding, bruising, thinning of the skin, loss of skin pigment, lack of resolution, and recurrence of lesion) and benefits of the procedure, as  well as the alternatives. Informed consent was obtained. Preparation: The area was prepared a standard fashion.  Anesthesia: none   Procedure Details: An intralesional injection was performed with Kenalog 2 mg/cc. 0.9 cc in total were injected.  Total number of injections:  <20  Plan: The patient was instructed on post-op care. Recommend OTC analgesia as needed for pain.   Ordered Medications: clobetasol (TEMOVATE) 0.05 % external solution  Post-inflammatory hyperpigmentation Head - Anterior (Face)  D/C otc Ambi cream (has been using 2 weeks)  Start skin Compounded Hydroquinone 12%, Kojic Acid 6%, Vitamin C 1%, Niacinamide 2% Cream ([facility]) apply to face bid x 2.5 months then stop   Recommend daily broad spectrum sunscreen SPF 30+ to sun-exposed areas, reapply every 2 hours as needed.    Return in about 6 weeks (around 10/12/2019) for acne .   I, Marye Round, CMA, am acting as scribe for Forest Gleason, MD . Documentation: I have reviewed the above documentation for accuracy and completeness, and I agree with the above.  Forest Gleason, MD

## 2019-08-31 NOTE — Patient Instructions (Addendum)
Doxycycline should be taken with food to prevent nausea. Do not lay down for 30 minutes after taking. Be cautious with sun exposure and use good sun protection while on this medication. Pregnant women should not take this medication.   Topical retinoid medications like tretinoin/Retin-A, adapalene/Differin, tazarotene/Fabior, and Epiduo/Epiduo Forte can cause dryness and irritation when first started. Only apply a pea-sized amount to the entire affected area. Avoid applying it around the eyes, edges of mouth and creases at the nose. If you experience irritation, use a good moisturizer first and/or apply the medicine less often. If you are doing well with the medicine, you can increase how often you use it until you are applying every night. Be careful with sun protection while using this medication as it can make you sensitive to the sun. This medicine should not be used by pregnant women.   Benzoyl peroxide can cause dryness and irritation of the skin. It can also bleach fabric.     Instructions for [facility] Medications  One or more of your medications was sent to the [facility] mail order compounding pharmacy. You will receive an email from them and can purchase the medicine through that link. It will then be mailed to your home at the address you confirmed. If for any reason you do not receive an email from them, please check your spam folder. If you still do not find the email, please let us know.

## 2019-10-04 ENCOUNTER — Ambulatory Visit: Payer: Managed Care, Other (non HMO) | Admitting: Dermatology

## 2019-10-07 ENCOUNTER — Other Ambulatory Visit: Payer: Self-pay

## 2019-10-07 ENCOUNTER — Ambulatory Visit (INDEPENDENT_AMBULATORY_CARE_PROVIDER_SITE_OTHER): Payer: Managed Care, Other (non HMO) | Admitting: Dermatology

## 2019-10-07 DIAGNOSIS — L700 Acne vulgaris: Secondary | ICD-10-CM

## 2019-10-07 DIAGNOSIS — S20469A Insect bite (nonvenomous) of unspecified back wall of thorax, initial encounter: Secondary | ICD-10-CM

## 2019-10-07 DIAGNOSIS — S40861A Insect bite (nonvenomous) of right upper arm, initial encounter: Secondary | ICD-10-CM

## 2019-10-07 DIAGNOSIS — S40862A Insect bite (nonvenomous) of left upper arm, initial encounter: Secondary | ICD-10-CM

## 2019-10-07 DIAGNOSIS — L639 Alopecia areata, unspecified: Secondary | ICD-10-CM

## 2019-10-07 DIAGNOSIS — W57XXXA Bitten or stung by nonvenomous insect and other nonvenomous arthropods, initial encounter: Secondary | ICD-10-CM

## 2019-10-07 MED ORDER — DOXYCYCLINE MONOHYDRATE 100 MG PO CAPS: 100.0000 mg | ORAL_CAPSULE | Freq: Two times a day (BID) | ORAL | 1 refills | Status: DC

## 2019-10-07 MED ORDER — TRIAMCINOLONE ACETONIDE 0.1 % EX OINT: TOPICAL_OINTMENT | CUTANEOUS | 0 refills | Status: DC

## 2019-10-07 NOTE — Progress Notes (Signed)
Follow-Up Visit   Subjective  Marcia Maldonado is a 49 y.o. female who presents for the following: Acne  Patient currently using Adapalene 0.3% gel QHS, BPO/clindamycin gel QAM, and Doxycycline 20mg  po BID. She has had improvement but she is still experiencing flares. She denies any new medications from earlier this year.  She has had a hysterectomy but not an oophorectomy.   The following portions of the chart were reviewed this encounter and updated as appropriate:  Tobacco  Allergies  Meds  Problems  Med Hx  Surg Hx  Fam Hx     Review of Systems:  No other skin or systemic complaints except as noted in HPI or Assessment and Plan.  Objective  Well appearing patient in no apparent distress; mood and affect are within normal limits.  A focused examination was performed including the scalp, face, neck, chest, back, arms. Relevant physical exam findings are noted in the Assessment and Plan.  Objective  Face: Many deep inflamed papules at the B/L cheeks, trace to 1+ open comedones. Chest clear, back with rare small inflammatory papule.  Objective  Back and arms: Erythematous papules.  Objective  Scalp: Round, patchy areas of nonscarring hair loss with some regrowth.  Assessment & Plan  Acne vulgaris Face  Chronic, flared  Will review patient medications and make sure no relation or cause to acne. Consider bloodwork.  Increase Doxycycline Monohydrate to 100mg  po BID. #60 1RF. Doxycycline should be taken with food to prevent nausea. Do not lay down for 30 minutes after taking. Be cautious with sun exposure and use good sun protection while on this medication. Pregnant women should not take this medication.   Continue BPO/Clindamycin gel QAM and Adapalene 0.3% gel QHS. Topical retinoid medications like tretinoin/Retin-A, adapalene/Differin, tazarotene/Fabior, and Epiduo/Epiduo Forte can cause dryness and irritation when first started. Only apply a pea-sized amount to the  entire affected area. Avoid applying it around the eyes, edges of mouth and creases at the nose. If you experience irritation, use a good moisturizer first and/or apply the medicine less often. If you are doing well with the medicine, you can increase how often you use it until you are applying every night. Be careful with sun protection while using this medication as it can make you sensitive to the sun. This medicine should not be used by pregnant women.    Discussed Spironolactone but patient declines treatment due to mother having breast cancer and being on Spironolactone.   Discussed Isotretinoin Reviewed potential side effects of isotretinoin including xerosis, cheilitis, hepatitis, hyperlipidemia, and birth defects if taken by a pregnant woman. Reviewed reports of suicidal ideation in those with a history of depression while taking isotretinoin and reports of diagnosis of inflammatory bowl disease while taking isotretinoin as well as the lack of evidence for a causal relationship between isotretinoin, depression and IBD. Patient had a hysterectomy.   Will consider blood work ([place], [place], DHEA, free and total testosterone, sex-hormone binding globulin, prolactin).  doxycycline (MONODOX) 100 MG capsule - Face  Other Related Medications AMBULATORY NON FORMULARY MEDICATION Adapalene (DIFFERIN) 0.3 % gel Clindamycin-Benzoyl Per, Refr, (DUAC) gel  Bug bite without infection, initial encounter Back and arms  Start TMC 0.1% oint to aa's BID until clear. Avoid applying to face, groin, and axilla. Use as directed. Risk of skin atrophy with long-term use reviewed.    Topical steroids (such as triamcinolone, fluocinolone, fluocinonide, mometasone, clobetasol, halobetasol, betamethasone, hydrocortisone) can cause thinning and lightening of the skin if they  are used for too long in the same area. Your physician has selected the right strength medicine for your problem and area affected on the body.  Please use your medication only as directed by your physician to prevent side effects.    triamcinolone ointment (KENALOG) 0.1 % - Back and arms  Alopecia areata Scalp  Recommend adding fexofenadine 180 mg daily. Continue Clobetasol sol. BID. Avoid applying to face, groin, and axilla. Use as directed. Risk of skin atrophy with long-term use reviewed.   Intralesional kenalog injection performed today: Kenalog 2mg /mL  Total volume: 2.20mL  # of injections: 14  Reviewed risk of thinning and lightening of the skin prior to injections.  Other Related Medications clobetasol (TEMOVATE) 0.05 % external solution  Return in about 2 months (around 12/07/2019) for Follow-up acne and alopecia areata.  Luther Redo, CMA, am acting as scribe for Forest Gleason, MD .  Documentation: I have reviewed the above documentation for accuracy and completeness, and I agree with the above.  Forest Gleason, MD

## 2019-10-07 NOTE — Patient Instructions (Signed)
Doxycycline should be taken with food to prevent nausea. Do not lay down for 30 minutes after taking. Be cautious with sun exposure and use good sun protection while on this medication. Pregnant women should not take this medication.   Topical retinoid medications like tretinoin/Retin-A, adapalene/Differin, tazarotene/Fabior, and Epiduo/Epiduo Forte can cause dryness and irritation when first started. Only apply a pea-sized amount to the entire affected area. Avoid applying it around the eyes, edges of mouth and creases at the nose. If you experience irritation, use a good moisturizer first and/or apply the medicine less often. If you are doing well with the medicine, you can increase how often you use it until you are applying every night. Be careful with sun protection while using this medication as it can make you sensitive to the sun. This medicine should not be used by pregnant women.   

## 2019-10-12 ENCOUNTER — Encounter: Payer: Self-pay | Admitting: Dermatology

## 2019-10-14 ENCOUNTER — Other Ambulatory Visit: Payer: Self-pay

## 2019-10-14 DIAGNOSIS — L639 Alopecia areata, unspecified: Secondary | ICD-10-CM

## 2019-10-14 NOTE — Progress Notes (Unsigned)
Please order [place], [place], DHEA, free and total testosterone, sex-hormone binding globulin, prolactin and let patient know to go by the lab for blood draw. Thank you!

## 2019-10-15 ENCOUNTER — Encounter: Payer: Self-pay | Admitting: Obstetrics and Gynecology

## 2019-11-22 ENCOUNTER — Other Ambulatory Visit: Payer: Self-pay

## 2019-11-22 ENCOUNTER — Encounter: Payer: Self-pay | Admitting: Obstetrics and Gynecology

## 2019-11-22 ENCOUNTER — Ambulatory Visit (INDEPENDENT_AMBULATORY_CARE_PROVIDER_SITE_OTHER): Payer: Managed Care, Other (non HMO) | Admitting: Obstetrics and Gynecology

## 2019-11-22 VITALS — BP 122/74 | Ht 67.0 in | Wt 255.0 lb

## 2019-11-22 DIAGNOSIS — N9089 Other specified noninflammatory disorders of vulva and perineum: Secondary | ICD-10-CM | POA: Diagnosis not present

## 2019-11-22 NOTE — Progress Notes (Signed)
Obstetrics & Gynecology Office Visit   Chief Complaint  Patient presents with  . Referral  Vulvar ulcerations  The patient is seen in referral at the request of Marguerita Merles, MD from [facility] for vulvar ulcerations.   History of Present Illness: 49 y.o. female who is seen in referral from Marguerita Merles, MD from [facility] for vulvar ulcerations.    She works for the [employer].  Every month or two she feels like she is having "cuts" in her genital area.  She describes the "cuts" as painful.  She has tried a triple antibiotic ointment and keep the area dry and clean.  She wears cotton underwear.    She first noticed these "cuts" about 5 months ago.  She notes them on the left side.  She notes that she has two of these lesions.  They are usually present for a couple. The areas are painful.  She has never had this kind of thing before.  She can think of no inciting factors.  Alleviating factors: as above.  Associated symptoms: none.  She notes no other sores on her body (including mouth).    History of syphilis.  This was treated.  She had this in the 1990s.  She was diagnosed during a screen for her to be a bone marrow donor.  She has not been tested since this time.  She has no spots today.   She had a recent pap smear.  She had a normal one.   Last mammogram: 2 months ago.  She is to go back in six months as this was [facility] 3.    Past Medical History:  Diagnosis Date  . Asthma   . Atypical chest pain   . Costochondritis   . De Quervain's disease (tenosynovitis)   . Hypertension   . Migraine     Past Surgical History:  Procedure Laterality Date  . APPENDECTOMY    . BREAST BIOPSY Left    neg  . CHOLECYSTECTOMY    . ECTOPIC PREGNANCY SURGERY    . TOTAL KNEE ARTHROPLASTY Left 03/04/2019   Procedure: LEFT TOTAL KNEE ARTHROPLASTY;  Surgeon: Hessie Knows, MD;  Location: [facility] ORS;  Service: Orthopedics;  Laterality: Left;  . TOTAL  LAPAROSCOPIC HYSTERECTOMY WITH SALPINGECTOMY      Gynecologic History: Patient's last menstrual period was 06/04/2012 (approximate).  Family History  Problem Relation Age of Onset  . Breast cancer Mother 54  . Cancer Father   . Leukemia Brother 17       CLL    Social History   Socioeconomic History  . Marital status: Married    Spouse name: Not on file  . Number of children: Not on file  . Years of education: Not on file  . Highest education level: Not on file  Occupational History  . Not on file  Tobacco Use  . Smoking status: Never Smoker  . Smokeless tobacco: Never Used  Vaping Use  . Vaping Use: Never used  Substance and Sexual Activity  . Alcohol use: Yes    Comment: sporadically  . Drug use: No  . Sexual activity: Not Currently    Birth control/protection: None  Other Topics Concern  . Not on file  Social History Narrative  . Not on file   Social Determinants of Health   Financial Resource Strain:   . Difficulty of Paying Living Expenses: Not on file  Food Insecurity:   . Worried About Estate manager/land agent  of Food in the Last Year: Not on file  . Ran Out of Food in the Last Year: Not on file  Transportation Needs:   . Lack of Transportation (Medical): Not on file  . Lack of Transportation (Non-Medical): Not on file  Physical Activity:   . Days of Exercise per Week: Not on file  . Minutes of Exercise per Session: Not on file  Stress:   . Feeling of Stress : Not on file  Social Connections:   . Frequency of Communication with Friends and Family: Not on file  . Frequency of Social Gatherings with Friends and Family: Not on file  . Attends Religious Services: Not on file  . Active Member of Clubs or Organizations: Not on file  . Attends Archivist Meetings: Not on file  . Marital Status: Not on file  Intimate Partner Violence:   . Fear of Current or Ex-Partner: Not on file  . Emotionally Abused: Not on file  . Physically Abused: Not on file  .  Sexually Abused: Not on file    Allergies  Allergen Reactions  . Erythromycin Hives    The patient personally hasn't had reaction but says that she has family history of reacting to it (since Native American) The patient personally hasn't had reaction but says that she has family history of reacting to it (since Native American)  . Phenylephrine-Guaifenesin Hives  . Guaifenesin Palpitations    HR goes up  . Guaifenesin & Derivatives Palpitations    HR goes up    Prior to Admission medications   Medication Sig Start Date End Date Taking? Authorizing Provider  Adapalene (DIFFERIN) 0.3 % gel Apply to face qhs 08/31/19   Moye, [place], MD  albuterol (PROVENTIL HFA;VENTOLIN HFA) 108 (90 BASE) MCG/ACT inhaler Inhale 2 puffs into the lungs every 6 (six) hours as needed for shortness of breath. For SOB      [provider]  AMBULATORY NON FORMULARY MEDICATION Medication Name: Compounded Hydroquinone 12%, Kojic Acid 6%, Vitamin C 1%, Niacinamide 2% Cream (Skin Medicinals) apply to face bid x 3 months then stop 08/31/19   Moye, [place], MD  amLODipine (NORVASC) 10 MG tablet Take 10 mg by mouth daily.    [provider]  Clindamycin-Benzoyl Per, Refr, (DUAC) gel Apply to face qam 08/31/19   Moye, [place], MD  clobetasol (TEMOVATE) 0.05 % external solution Apply 1 application topically 2 (two) times daily. Avoid face, groin, axilla 08/31/19   Moye, [place], MD  doxycycline (MONODOX) 100 MG capsule Take 1 capsule (100 mg total) by mouth 2 (two) times daily. Take with food and plenty of drink. 10/07/19   Moye, [place], MD  enoxaparin (LOVENOX) 40 MG/0.4ML injection Inject 0.4 mLs (40 mg total) into the skin daily for 14 days. 03/05/19 03/19/19  Duanne Guess, PA-C  hydrochlorothiazide (HYDRODIURIL) 25 MG tablet Take 12.5 mg by mouth daily.     [provider]  HYDROcodone-acetaminophen (NORCO) 7.5-325 MG tablet Take 1-2 tablets by mouth every 6 (six) hours as needed for  moderate pain or severe pain. Patient not taking: Reported on 10/07/2019 03/05/19   Duanne Guess, PA-C  labetalol (NORMODYNE) 200 MG tablet Take 200 mg by mouth 2 (two) times daily.     [provider]  ondansetron (ZOFRAN) 4 MG tablet Take 1 tablet (4 mg total) by mouth every 6 (six) hours as needed for nausea. Patient not taking: Reported on 10/07/2019 03/05/19   Duanne Guess, PA-C  ondansetron (ZOFRAN-ODT)  4 MG disintegrating tablet Take 1 tablet (4 mg total) by mouth every 8 (eight) hours as needed for nausea or vomiting. Patient not taking: Reported on 10/07/2019 01/08/18   Jacqulyn Cane, MD  oxyCODONE-acetaminophen (PERCOCET/ROXICET) 5-325 MG tablet Take 1 tablet by mouth every 6 (six) hours as needed for severe pain. Patient not taking: Reported on 10/07/2019 03/10/19   Cuthriell, Charline Bills, PA-C  predniSONE (STERAPRED UNI-PAK 21 TAB) 10 MG (21) TBPK tablet Take 6 tablets the first day, take 5 tablets the second day, take 4 tablets the third day, take 3 tablets the fourth day, take 2 tablets the fifth day, take 1 tablet the sixth day. Patient not taking: Reported on 10/07/2019 06/25/19   Lannie Fields, PA-C  promethazine-codeine [facility] WITH CODEINE) 6.25-10 MG/5ML syrup Take 1-2 teaspoons every 6 hours as needed for severe cough. May cause drowsiness. Patient not taking: Reported on 10/07/2019 01/08/18   Jacqulyn Cane, MD  SUMAtriptan (IMITREX) 25 MG tablet Take 25 mg by mouth every 2 (two) hours as needed for migraine. May repeat in 2 hours if headache persists or recurs.    [provider]  topiramate (TOPAMAX) 50 MG tablet Take 50 mg by mouth daily.    [provider]  traMADol (ULTRAM) 50 MG tablet Take 1-2 tablets (50-100 mg total) by mouth every 6 (six) hours. Patient not taking: Reported on 10/07/2019 03/07/19   Lattie Corns, PA-C  triamcinolone ointment (KENALOG) 0.1 % Apply to aa's bug bites BID PRN. Avoid f/g/a. 10/07/19   Alfonso Patten, MD     Review of Systems  Constitutional: Negative.   HENT: Negative.   Eyes: Negative.   Respiratory: Negative.   Cardiovascular: Negative.   Gastrointestinal: Negative.   Genitourinary: Negative.   Musculoskeletal: Negative.   Skin: Negative.   Neurological: Negative.   Psychiatric/Behavioral: Negative.      Physical Exam BP 122/74   Ht 5\' 7"  (1.702 m)   Wt 255 lb (115.7 kg)   LMP 06/04/2012 (Approximate)   BMI 39.94 kg/m  Patient's last menstrual period was 06/04/2012 (approximate). Physical Exam Constitutional:      General: She is not in acute distress.    Appearance: Normal appearance. She is well-developed.  Genitourinary:     Pelvic exam was performed with patient in the lithotomy position.     Vulva, inguinal canal, urethra, bladder, vagina, uterus, right adnexa and left adnexa normal.     No posterior fourchette tenderness, injury or lesion present.     No cervical friability, lesion, bleeding or polyp.     Genitourinary Comments: No lesions noted  HENT:     Head: Normocephalic and atraumatic.  Eyes:     General: No scleral icterus.    Conjunctiva/sclera: Conjunctivae normal.  Cardiovascular:     Rate and Rhythm: Normal rate and regular rhythm.     Heart sounds: No murmur heard.  No friction rub. No gallop.   Pulmonary:     Effort: Pulmonary effort is normal. No respiratory distress.     Breath sounds: Normal breath sounds. No wheezing or rales.  Abdominal:     General: Bowel sounds are normal. There is no distension.     Palpations: Abdomen is soft. There is no mass.     Tenderness: There is no abdominal tenderness. There is no guarding or rebound.  Musculoskeletal:        General: Normal range of motion.     Cervical back: Normal range of motion and neck supple.  Neurological:     General: No focal deficit present.     Mental Status: She is alert and oriented to person, place, and time.     Cranial Nerves: No cranial nerve deficit.  Skin:    General:  Skin is warm and dry.     Findings: No erythema.  Psychiatric:        Mood and Affect: Mood normal.        Behavior: Behavior normal.        Judgment: Judgment normal.     Female chaperone present for pelvic and breast  portions of the physical exam  Assessment: 49 y.o. No obstetric history on file. female here for  1. Vulvar lesion      Plan: Problem List Items Addressed This Visit    None    Visit Diagnoses    Vulvar lesion    -  Primary   Relevant Orders   RPR Qual     - no lesions noted today, call when develop and get back into clinic.   - RPR just in case - otherwise exam normal, s/p TLH  Discussed multiple possible etiologies. I'm concerned that the lesion recur in the same location.  She has a very distant history of syphilis. Will check RPR today to make sure no concerns.  Consider HSV culture when returns with lesions present. May consider less common causes that are sexually transmitted. I think Behcet Syndrome is less likely given her lack of other systemic symptoms.    A total of 40 minutes were spent face-to-face with the patient as well as preparation, review, communication, and documentation during this encounter.    Prentice Docker, MD 11/22/2019 6:09 PM    CC: Marguerita Merles, [place] [place],  [place] [phone]

## 2019-11-24 LAB — RPR QUALITATIVE: RPR Ser Ql: REACTIVE — AB

## 2019-11-24 LAB — RPR, QUANT. (REFLEX): Rapid Plasma Reagin, Quant: 1:2 {titer} — ABNORMAL HIGH

## 2019-11-30 ENCOUNTER — Ambulatory Visit: Payer: Managed Care, Other (non HMO) | Admitting: Dermatology

## 2019-12-01 ENCOUNTER — Other Ambulatory Visit: Payer: Self-pay

## 2019-12-01 ENCOUNTER — Ambulatory Visit (INDEPENDENT_AMBULATORY_CARE_PROVIDER_SITE_OTHER): Payer: Managed Care, Other (non HMO) | Admitting: Dermatology

## 2019-12-01 DIAGNOSIS — L700 Acne vulgaris: Secondary | ICD-10-CM

## 2019-12-01 DIAGNOSIS — L810 Postinflammatory hyperpigmentation: Secondary | ICD-10-CM | POA: Diagnosis not present

## 2019-12-01 DIAGNOSIS — L639 Alopecia areata, unspecified: Secondary | ICD-10-CM

## 2019-12-01 DIAGNOSIS — L659 Nonscarring hair loss, unspecified: Secondary | ICD-10-CM

## 2019-12-01 MED ORDER — DOXYCYCLINE MONOHYDRATE 100 MG PO CAPS: 100.0000 mg | ORAL_CAPSULE | Freq: Two times a day (BID) | ORAL | 1 refills | Status: DC

## 2019-12-01 MED ORDER — CLOBETASOL PROPIONATE 0.05 % EX SOLN: 1.0000 "application " | Freq: Two times a day (BID) | CUTANEOUS | 2 refills | Status: DC

## 2019-12-01 MED ORDER — TRETINOIN 0.025 % EX CREA: TOPICAL_CREAM | CUTANEOUS | 2 refills | Status: AC

## 2019-12-01 NOTE — Progress Notes (Signed)
   Follow-Up Visit   Subjective  Marcia Maldonado is a 49 y.o. female who presents for the following: Follow-up.  Patient here for 2 month acne and alopecia areata follow up. She is taking doxycycline monohydrate 100mg  daily, clindamycin in the am and Differin 0.3% gel at bedtime. Acne is improved, wearing masks worsens. She is using clobetasol solution twice a day to scalp and had IL injections at last visit for alopecia but she feels hair loss has worsened.   The following portions of the chart were reviewed this encounter and updated as appropriate:  Tobacco  Allergies  Meds  Problems  Med Hx  Surg Hx  Fam Hx      Review of Systems:  No other skin or systemic complaints except as noted in HPI or Assessment and Plan.  Objective  Well appearing patient in no apparent distress; mood and affect are within normal limits.  A focused examination was performed including face, neck, chest and back and scalp. Relevant physical exam findings are noted in the Assessment and Plan.  Objective  Face: Trace to 1+ open comedones, scattered resolving inflammatory papules at bilateral cheeks  Objective  Scalp: Patch of alopecia mid frontal scalp with some regrowth  Objective  Face: Hyperpigmented macules   Assessment & Plan  Acne vulgaris Face  Chronic condition, no cure, only control. Not currently at goal.   Start Cln acne wash. Start tretinoin 0.025% cream nightly as directed. Continue doxycycline monohydrate 100mg  daily with food. Continue clindamycin in the morning.  Doxycycline should be taken with food to prevent nausea. Do not lay down for 30 minutes after taking. Be cautious with sun exposure and use good sun protection while on this medication. Pregnant women should not take this medication.   Topical retinoid medications like tretinoin/Retin-A, adapalene/Differin, tazarotene/Fabior, and Epiduo/Epiduo Forte can cause dryness and irritation when first started. Only apply a  pea-sized amount to the entire affected area. Avoid applying it around the eyes, edges of mouth and creases at the nose. If you experience irritation, use a good moisturizer first and/or apply the medicine less often. If you are doing well with the medicine, you can increase how often you use it until you are applying every night. Be careful with sun protection while using this medication as it can make you sensitive to the sun. This medicine should not be used by pregnant women.    Ordered Medications: tretinoin (RETIN-A) 0.025 % cream  Reordered Medications doxycycline (MONODOX) 100 MG capsule  Other Related Medications Clindamycin-Benzoyl Per, Refr, (DUAC) gel  Alopecia Scalp  Slight improvement Not at goal  Start Allegra 180mg  once daily Continue clobetasol solution twice daily.  Will plan IL triamcinolone injections at next visit. Patient had Covid booster today.  Post-inflammatory hyperpigmentation Face  Continue Skin Medicinals Hydroquinone 12%/kojic acid/vitamin C cream pea sized amount twice daily to the entire face for up to 3 months. This cannot be used more than 3 months due to risk of exogenous ochronosis (permanent dark spots).   Alopecia areata  Reordered Medications clobetasol (TEMOVATE) 0.05 % external solution  Return in about 2 months (around 01/31/2020) for alopecia and acne follow up.  Graciella Belton, RMA, am acting as scribe for Forest Gleason, MD .  Documentation: I have reviewed the above documentation for accuracy and completeness, and I agree with the above.  Forest Gleason, MD

## 2019-12-01 NOTE — Patient Instructions (Addendum)
Topical retinoid medications like tretinoin/Retin-A, adapalene/Differin, tazarotene/Fabior, and Epiduo/Epiduo Forte can cause dryness and irritation when first started. Only apply a pea-sized amount to the entire affected area. Avoid applying it around the eyes, edges of mouth and creases at the nose. If you experience irritation, use a good moisturizer first and/or apply the medicine less often. If you are doing well with the medicine, you can increase how often you use it until you are applying every night. Be careful with sun protection while using this medication as it can make you sensitive to the sun. This medicine should not be used by pregnant women.   Doxycycline should be taken with food to prevent nausea. Do not lay down for 30 minutes after taking. Be cautious with sun exposure and use good sun protection while on this medication. Pregnant women should not take this medication.   Start Allegra (fexofenadine) 180mg  24 hour daily.

## 2019-12-13 ENCOUNTER — Encounter: Payer: Self-pay | Admitting: Dermatology

## 2019-12-30 ENCOUNTER — Ambulatory Visit: Payer: Managed Care, Other (non HMO) | Admitting: Obstetrics and Gynecology

## 2020-02-01 ENCOUNTER — Telehealth: Payer: Self-pay | Admitting: Obstetrics and Gynecology

## 2020-02-01 NOTE — Telephone Encounter (Signed)
Discussed RPR results and discussion with Dr. Ernestina Patches at [place]. Patient understands to call [facility] to schedule appt for treatment of possible syphilis.  All questions answered and patient states that she would call to make the appt.

## 2020-02-10 ENCOUNTER — Other Ambulatory Visit: Payer: Self-pay

## 2020-02-10 ENCOUNTER — Ambulatory Visit: Payer: Managed Care, Other (non HMO) | Admitting: Dermatology

## 2020-02-10 DIAGNOSIS — W57XXXA Bitten or stung by nonvenomous insect and other nonvenomous arthropods, initial encounter: Secondary | ICD-10-CM | POA: Diagnosis not present

## 2020-02-10 DIAGNOSIS — L639 Alopecia areata, unspecified: Secondary | ICD-10-CM | POA: Diagnosis not present

## 2020-02-10 DIAGNOSIS — L700 Acne vulgaris: Secondary | ICD-10-CM

## 2020-02-10 DIAGNOSIS — R21 Rash and other nonspecific skin eruption: Secondary | ICD-10-CM | POA: Diagnosis not present

## 2020-02-10 MED ORDER — TRIAMCINOLONE ACETONIDE 0.1 % EX OINT: TOPICAL_OINTMENT | CUTANEOUS | 0 refills | Status: AC

## 2020-02-10 NOTE — Patient Instructions (Addendum)
Topical steroids (such as triamcinolone, fluocinolone, fluocinonide, mometasone, clobetasol, halobetasol, betamethasone, hydrocortisone) can cause thinning and lightening of the skin if they are used for too long in the same area. Your physician has selected the right strength medicine for your problem and area affected on the body. Please use your medication only as directed by your physician to prevent side effects.   Gentle Skin Care Guide  1. Bathe no more than once a day.  2. Avoid bathing in hot water  3. Use a mild soap like Dove, Vanicream, Cetaphil, CeraVe. Can use Lever 2000 or Cetaphil antibacterial soap  4. Use soap only where you need it. On most days, use it under your arms, between your legs, and on your feet. Let the water rinse other areas unless visibly dirty.  5. When you get out of the bath/shower, use a towel to gently blot your skin dry, don't rub it.  6. While your skin is still a little damp, apply a moisturizing cream such as Vanicream, CeraVe, Cetaphil, Eucerin, Sarna lotion or plain Vaseline Jelly. For hands apply Neutrogena Norwegian Hand Cream or Excipial Hand Cream.  7. Reapply moisturizer any time you start to itch or feel dry.  8. Sometimes using free and clear laundry detergents can be helpful. Fabric softener sheets should be avoided. Downy Free & Gentle liquid, or any liquid fabric softener that is free of dyes and perfumes, it acceptable to use  9. If your doctor has given you prescription creams you may apply moisturizers over them     

## 2020-02-10 NOTE — Progress Notes (Signed)
Follow-Up Visit   Subjective  Marcia Maldonado is a 50 y.o. female who presents for the following: Follow-up (Patient here today for 2 month alopecia areata and acne follow up. She is using tretinoin 0.025% cream, clindamycin and taking doxycycline 100mg  daily, acne has improved. She also had a chemical peel for sensitive skin yesterday at [facility]. Patient is using clobetasol solution twice daily and has noticed improvement with hair loss, here today to start IL injections. ).  Patient advises she has noticed what looks like bites at wrists and chests. She was prescribed TMC 0.1% ointment to use last September. Areas do itch.   The following portions of the chart were reviewed this encounter and updated as appropriate:   Tobacco  Allergies  Meds  Problems  Med Hx  Surg Hx  Fam Hx      Review of Systems:  No other skin or systemic complaints except as noted in HPI or Assessment and Plan.  Objective  Well appearing patient in no apparent distress; mood and affect are within normal limits.  A focused examination was performed including face, arms, hands, scalp. Relevant physical exam findings are noted in the Assessment and Plan.  Objective  Scalp: Patches of alopecia with widespread regrowth  Objective  Face: Scattered resolving inflammatory papules at cheeks, trace open comedones  Objective  Bilateral Forearm, wrist: Scaly pink papules coalescing to plaques    Assessment & Plan  Alopecia areata Scalp  Continue clobetasol solution twice daily  Start Allegra 180mg  daily  Topical steroids (such as triamcinolone, fluocinolone, fluocinonide, mometasone, clobetasol, halobetasol, betamethasone, hydrocortisone) can cause thinning and lightening of the skin if they are used for too long in the same area. Your physician has selected the right strength medicine for your problem and area affected on the body. Please use your medication only as directed by your physician to  prevent side effects.    Other Related Medications clobetasol (TEMOVATE) 0.05 % external solution  Acne vulgaris Face  Chronic condition with expected duration over one year. Currently well-controlled.  Continue tretinoin 0.025% cream QHS (pt had recent peel so will hold a few days) Continue clindamycin solution QAM Continue doxycycline 100mg  twice daily with food. Will plan decrease to 20 mg twice a day if doing well at follow-up.  Topical retinoid medications like tretinoin/Retin-A, adapalene/Differin, tazarotene/Fabior, and Epiduo/Epiduo Forte can cause dryness and irritation when first started. Only apply a pea-sized amount to the entire affected area. Avoid applying it around the eyes, edges of mouth and creases at the nose. If you experience irritation, use a good moisturizer first and/or apply the medicine less often. If you are doing well with the medicine, you can increase how often you use it until you are applying every night. Be careful with sun protection while using this medication as it can make you sensitive to the sun. This medicine should not be used by pregnant women.   Doxycycline should be taken with food to prevent nausea. Do not lay down for 30 minutes after taking. Be cautious with sun exposure and use good sun protection while on this medication. Pregnant women should not take this medication.  Other Related Medications Clindamycin-Benzoyl Per, Refr, (DUAC) gel tretinoin (RETIN-A) 0.025 % cream doxycycline (MONODOX) 100 MG capsule  Rash Bilateral Forearm, wrist  Favor eczema vs contact dermatitis vs hypersensitivity.  Gentle Skin Care Guide attached to AVS for patient  Discussed biopsy, patch testing it not improving.  Restart TMC 0.1% ointment twice daily for  2-3 weeks. Avoid applying to face, groin, and axilla. Use as directed. Risk of skin atrophy with long-term use reviewed.   Topical steroids (such as triamcinolone, fluocinolone, fluocinonide,  mometasone, clobetasol, halobetasol, betamethasone, hydrocortisone) can cause thinning and lightening of the skin if they are used for too long in the same area. Your physician has selected the right strength medicine for your problem and area affected on the body. Please use your medication only as directed by your physician to prevent side effects.    Reordered Medications triamcinolone ointment (KENALOG) 0.1 %  Return in about 4 weeks (around 03/09/2020) for alopecia, rash.  Graciella Belton, RMA, am acting as scribe for Forest Gleason, MD .  Documentation: I have reviewed the above documentation for accuracy and completeness, and I agree with the above.  Forest Gleason, MD

## 2020-02-14 ENCOUNTER — Ambulatory Visit: Payer: Self-pay | Admitting: Physician Assistant

## 2020-02-14 ENCOUNTER — Other Ambulatory Visit: Payer: Self-pay

## 2020-02-14 ENCOUNTER — Encounter: Payer: Self-pay | Admitting: Dermatology

## 2020-02-14 DIAGNOSIS — A539 Syphilis, unspecified: Secondary | ICD-10-CM

## 2020-02-14 DIAGNOSIS — Z113 Encounter for screening for infections with a predominantly sexual mode of transmission: Secondary | ICD-10-CM

## 2020-02-15 ENCOUNTER — Encounter: Payer: Self-pay | Admitting: Physician Assistant

## 2020-02-15 DIAGNOSIS — A539 Syphilis, unspecified: Secondary | ICD-10-CM | POA: Insufficient documentation

## 2020-02-15 NOTE — Progress Notes (Signed)
S:  Patient states that she was referred to [facility] due to her Syphilis test results.  States that she has a history of Syphilis that was treated about 20 years ago at [facility].  States that she has been with her husband only for 18 years and her last sex was over 1 year ago.  States that she is taking 3 medicines for her blood pressure and Doxycycline 100 mg BID for her acne, some vitamin supplements and an alopecia shampoo.  Denies any recent symptoms of Syphilis. O:  WDWN female in NAD, A&O x 3, normal work of breathing.  Per chart review, RPR 11/2019 was reactive 1:2. A/P:  1.  Patient referred for Syphilis test being positive at outside office. 2.  Reviewed with patient that she has a stable titer and that she does not need further treatment at this time. 3.  Counseled that the Doxycycline she has been taking for her acne would have treated Syphilis if she had needed re-treatment that would have taken care of the infection. 4.  Per call to DIS patient was treated in 2003 at [facility] with Bicillin 2.4 mu IM x 3 for RPR 1:32, RPR 03/2007 was 1:4. 5.  RTC prn and call with questions or concerns.

## 2020-03-23 ENCOUNTER — Ambulatory Visit: Payer: Managed Care, Other (non HMO) | Admitting: Dermatology

## 2020-03-29 ENCOUNTER — Ambulatory Visit: Payer: Managed Care, Other (non HMO) | Admitting: Dermatology

## 2020-03-29 ENCOUNTER — Other Ambulatory Visit: Payer: Self-pay

## 2020-03-29 DIAGNOSIS — L308 Other specified dermatitis: Secondary | ICD-10-CM | POA: Diagnosis not present

## 2020-03-29 DIAGNOSIS — R21 Rash and other nonspecific skin eruption: Secondary | ICD-10-CM | POA: Diagnosis not present

## 2020-03-29 DIAGNOSIS — L700 Acne vulgaris: Secondary | ICD-10-CM | POA: Diagnosis not present

## 2020-03-29 DIAGNOSIS — L659 Nonscarring hair loss, unspecified: Secondary | ICD-10-CM

## 2020-03-29 MED ORDER — HYDROXYZINE HCL 25 MG PO TABS: 25.0000 mg | ORAL_TABLET | Freq: Every evening | ORAL | 0 refills | Status: AC | PRN

## 2020-03-29 MED ORDER — CLOBETASOL PROPIONATE 0.05 % EX OINT: TOPICAL_OINTMENT | CUTANEOUS | 1 refills | Status: DC

## 2020-03-29 NOTE — Patient Instructions (Addendum)
Biopsy Wound Care Instructions  1. Leave the original bandage on for 24 hours if possible.  If the bandage becomes soaked or soiled before that time, it is OK to remove it and examine the wound.  A small amount of post-operative bleeding is normal.  If excessive bleeding occurs, remove the bandage, place gauze over the site and apply continuous pressure (no peeking) over the area for 30 minutes. If this does not work, please call our clinic as soon as possible or page your doctor if it is after hours.   2. Once a day, cleanse the wound with soap and water. It is fine to shower. If a thick crust develops you may use a Q-tip dipped into dilute hydrogen peroxide (mix 1:1 with water) to dissolve it.  Hydrogen peroxide can slow the healing process, so use it only as needed.    3. After washing, apply petroleum jelly (Vaseline) or an antibiotic ointment if your doctor prescribed one for you, followed by a bandage.    4. For best healing, the wound should be covered with a layer of ointment at all times. If you are not able to keep the area covered with a bandage to hold the ointment in place, this may mean re-applying the ointment several times a day.  Continue this wound care until the wound has healed and is no longer open.   Itching and mild discomfort is normal during the healing process. However, if you develop pain or severe itching, please call our office.   If you have any discomfort, you can take Tylenol (acetaminophen) or ibuprofen as directed on the bottle. (Please do not take these if you have an allergy to them or cannot take them for another reason).  Some redness, tenderness and white or yellow material in the wound is normal healing.  If the area becomes very sore and red, or develops a thick yellow-green material (pus), it may be infected; please notify us.    If you have stitches, return to clinic as directed to have the stitches removed. You will continue wound care for 2-3 days after  the stitches are removed.   Wound healing continues for up to one year following surgery. It is not unusual to experience pain in the scar from time to time during the interval.  If the pain becomes severe or the scar thickens, you should notify the office.    A slight amount of redness in a scar is expected for the first six months.  After six months, the redness will fade and the scar will soften and fade.  The color difference becomes less noticeable with time.  If there are any problems, return for a post-op surgery check at your earliest convenience.  To improve the appearance of the scar, you can use silicone scar gel, cream, or sheets (such as Mederma or Serica) every night for up to one year. These are available over the counter (without a prescription).  Please call our office at [phone] for any questions or concerns.   For clobetasol  Topical steroids (such as triamcinolone, fluocinolone, fluocinonide, mometasone, clobetasol, halobetasol, betamethasone, hydrocortisone) can cause thinning and lightening of the skin if they are used for too long in the same area. Your physician has selected the right strength medicine for your problem and area affected on the body. Please use your medication only as directed by your physician to prevent side effects.  Avoid applying to face, groin, and axilla. Use as directed. Risk of skin  atrophy with long-term use reviewed.

## 2020-03-29 NOTE — Progress Notes (Signed)
Follow-Up Visit   Subjective  Marcia Maldonado is a 50 y.o. female who presents for the following: 6 week follow up (Patient here today for follow up on alopecia, acne, and rash. Patient reports feeling acne has gotten better. Patient reports still having trouble with rash on bilateral arms and legs. She has tried switching her soap and fabric wash. She states she itches at night and cream helps some but still noticing new areas. Patient reports feeling scalp is not getting better and has inflamed area on top of scalp today. ).   The following portions of the chart were reviewed this encounter and updated as appropriate:  Tobacco  Allergies  Meds  Problems  Med Hx  Surg Hx  Fam Hx      Objective  Well appearing patient in no apparent distress; mood and affect are within normal limits.  A focused examination was performed including scalp, arms, legs. Relevant physical exam findings are noted in the Assessment and Plan.  Objective  Left upper arm: Edematous pink plaques and papules bilateral arms and legs  Images    Objective  face, chest: Trace open comedones face  Rare inflammatory papule at chest  Objective  Scalp: Red plaques with hair loss and breakage and without appreciable scale  Images    Assessment & Plan  Rash and other nonspecific skin eruption Left upper arm  Recommend biopsy of tissue today from left upper arm   Clobetasol 0.05 % ointment apply to affected areas twice daily for up to 2 week. Avoid applying to face, groin, and axilla. Use as directed. Risk of skin atrophy with long-term use reviewed.    Start hydroxyzine 25 mg take 1 to 2 tablets by mouth at night as needed for itch  Topical steroids (such as triamcinolone, fluocinolone, fluocinonide, mometasone, clobetasol, halobetasol, betamethasone, hydrocortisone) can cause thinning and lightening of the skin if they are used for too long in the same area. Your physician has selected the right  strength medicine for your problem and area affected on the body. Please use your medication only as directed by your physician to prevent side effects.      Skin / nail biopsy - Left upper arm Type of biopsy: punch   Informed consent: discussed and consent obtained   Timeout: patient name, date of birth, surgical site, and procedure verified   Patient was prepped and draped in usual sterile fashion: Area prepped with isopropyl alcohol. Anesthesia: the lesion was anesthetized in a standard fashion   Anesthetic:  1% lidocaine w/ epinephrine 1-100,000 buffered w/ 8.4% NaHCO3 Punch size:  4 mm Suture size:  4-0 Suture type: Prolene (polypropylene)   Hemostasis achieved with: suture and aluminum chloride   Outcome: patient tolerated procedure well   Post-procedure details: wound care instructions given   Additional details:  Mupirocin and a dressing applied  Specimen 1 - Surgical pathology Differential Diagnosis: hypersensitivity vs bite rxn vs ACD vs other  Check Margins: No Edematous pink plaques and papules bilateral arms and legs  Ordered Medications: clobetasol ointment (TEMOVATE) 0.05 % hydrOXYzine (ATARAX/VISTARIL) 25 MG tablet  Other Related Medications hydrOXYzine (ATARAX/VISTARIL) 25 MG tablet predniSONE (DELTASONE) 5 MG tablet  Acne vulgaris face, chest  Chronic condition with duration over one year. Currently well-controlled.  Dc doxycycline 100 mg  Start doxycycline 20 mg by mouth twice daily with food  Doxycycline should be taken with food to prevent nausea. Do not lay down for 30 minutes after taking. Be cautious with sun  exposure and use good sun protection while on this medication. Pregnant women should not take this medication.   Continue Tretinoin at qhs  Continue Clindamycin gel qam   Other Related Medications Clindamycin-Benzoyl Per, Refr, (DUAC) gel tretinoin (RETIN-A) 0.025 % cream doxycycline (MONODOX) 100 MG capsule  Alopecia Scalp  R/o  psoriasis vs tinea vs acd vs syphilis vs other   D/c clobetasol solution at this time.     Skin / nail biopsy - Scalp Type of biopsy: punch   Informed consent: discussed and consent obtained   Timeout: patient name, date of birth, surgical site, and procedure verified   Patient was prepped and draped in usual sterile fashion: Area prepped with isopropyl alcohol. Anesthesia: the lesion was anesthetized in a standard fashion   Anesthetic:  1% lidocaine w/ epinephrine 1-100,000 buffered w/ 8.4% NaHCO3 Punch size:  4 mm Suture size:  4-0 Suture type: Prolene (polypropylene)   Hemostasis achieved with: suture and aluminum chloride   Outcome: patient tolerated procedure well   Post-procedure details: wound care instructions given   Additional details:  Mupirocin and a dressing applied  Specimen 2 - Surgical pathology Differential Diagnosis: R/o psoriasis vs tinea vs acd vs syphilis vs other  Check Margins: No  Return in about 2 weeks (around 04/12/2020) for SR , .  I, Ruthell Rummage, CMA, am acting as scribe for Forest Gleason, MD.  Documentation: I have reviewed the above documentation for accuracy and completeness, and I agree with the above.  Forest Gleason, MD

## 2020-04-01 ENCOUNTER — Encounter: Payer: Self-pay | Admitting: Dermatology

## 2020-04-03 ENCOUNTER — Telehealth: Payer: Self-pay

## 2020-04-03 NOTE — Telephone Encounter (Signed)
Patient called into the office asking if its okay to wash hair due to punch biopsy. Advised patient okay to wash but do not scrub in that area.   Patient states since the two punch biopsies she feels like she has gotten much worse and feels as if bugs are crawling under her skin. Patient also complains of headache. States she was unable to go to work. Patient says if the headache continues she will end up in the ER tonight for medication.   She also wants to know should she continue taking antihistamines?

## 2020-04-04 ENCOUNTER — Other Ambulatory Visit: Payer: Self-pay

## 2020-04-04 DIAGNOSIS — R21 Rash and other nonspecific skin eruption: Secondary | ICD-10-CM

## 2020-04-04 MED ORDER — PREDNISONE 5 MG PO TABS: ORAL_TABLET | ORAL | 0 refills | Status: DC

## 2020-04-04 MED ORDER — HYDROXYZINE HCL 25 MG PO TABS: ORAL_TABLET | ORAL | 1 refills | Status: AC

## 2020-04-04 NOTE — Telephone Encounter (Signed)
I just reviewed patient's pathology results showing a hypersensitivity reaction and together with her worsening, I would recommend treating with a 3 week prednisone taper starting at 60 mg.   We can also prescribe some hydroxyzine 25 mg tablets take 1-2 tablets at night as needed for itch (caution sedation risk and do not combine with benadryl).   I would also suggest Cetirizine (zyrtec) or loratadine (claritin) 10 mg tablet twice a day. We will need to review her medications and possible other triggers for hypersensitivity when she comes for suture removal. Please let me know if she has any other questions for right now.  I am unsure how this would be related to her headache other than possibly having a headache related to a medication as well or due to stress related to the rash.   Please review risks of prednisone taper before sending in, including - mood irritability, insomnia, weight gain, stomach ulcers, increased risk of infection, increased blood sugar (diabetes), hypertension, osteoporosis with long-term or frequent use, and very rare risk of avascular necrosis of the hip (death of hip bone).

## 2020-04-09 ENCOUNTER — Encounter: Payer: Self-pay | Admitting: Dermatology

## 2020-04-11 ENCOUNTER — Ambulatory Visit (INDEPENDENT_AMBULATORY_CARE_PROVIDER_SITE_OTHER): Payer: Managed Care, Other (non HMO) | Admitting: Dermatology

## 2020-04-11 ENCOUNTER — Other Ambulatory Visit: Payer: Self-pay

## 2020-04-11 ENCOUNTER — Encounter: Payer: Self-pay | Admitting: Dermatology

## 2020-04-11 DIAGNOSIS — R21 Rash and other nonspecific skin eruption: Secondary | ICD-10-CM | POA: Diagnosis not present

## 2020-04-11 DIAGNOSIS — L308 Other specified dermatitis: Secondary | ICD-10-CM

## 2020-04-11 NOTE — Progress Notes (Signed)
Follow-Up Visit   Subjective  Marcia Maldonado is a 50 y.o. female who presents for the following: Follow-up (Pt here to discuss biopsy results and suture removal).  Feeling better on prednisone, clobetasol and hydroxyzine.   She cut out all medications except blood pressure medications, topiramate and omeprazole since getting her biopsy results back. She occasionally takes hydrocodone-acetaminophen which was started in the last 6 months. She occasionally takes sumatriptan, started over 1 year ago.   Amlodipine, labetalol and losartan-HCTZ were all started years ago.  Topiramate was started over 1 year ago.  She had started doxycycline for acne in August of last year before stopping it last week.   Before stopping supplements in the last week, she was taking B12, vision supplements, 1 a day women's multivitamin, and vitamin Supplement for hair skin and nails. She had started these several months ago.   The following portions of the chart were reviewed this encounter and updated as appropriate:   Tobacco  Allergies  Meds  Problems  Med Hx  Surg Hx  Fam Hx      Review of Systems:  No other skin or systemic complaints except as noted in HPI or Assessment and Plan.  Objective  Well appearing patient in no apparent distress; mood and affect are within normal limits.  A focused examination was performed including scalp, L arm. Relevant physical exam findings are noted in the Assessment and Plan.  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 The histologic differential diagnosis in this setting includes a drug reaction and a connective tissue  disorder such as lupus erythematosus or dermatomyositis. Correlation with the clinical and serologic findings is recommended.  Possible causes include doxycycline, supplements she was taking or one of her current medications. Unfortunately, all of her current medications including amlodipine, topiramate, labetalol, and HCTZ-losartan can cause a reaction like this and none of them were recently started, making it difficult to determine which could be the cause.   Will plan to wait 2-3 months after d/c of doxycycline and supplements to see how rash does. If still persistent, may consider possibility of her other medications. Of those, amlodipine is the one that most often causes a rash like this so we could consider substituting that one if she does not have improvement in the next 2-3 months.  Encounter for Removal of Sutures - Incision site at the scalp, L arm  is clean, dry and intact - Wound cleansed, sutures removed, wound cleansed and steri strips applied.  - Discussed pathology results showing  DERMAL HYPERSENSITIVITY REACTION, CONSISTENT WITH A DRUG ERUPTION - Scars remodel for a full year. - patient can apply over-the-counter silicone scar cream each night to help with scar remodeling if desired. - Patient advised to call with any concerns or if they notice any new or changing lesions.   Continue prednisone taper.   We will request a copy of most recent labs from PCP, If no ANA with reflex we will order ANA with reflex   Cont current regimen Clobetasol ointment bid for up to 2 weeks, then weekends only. Avoid applying to face, groin, and axilla. Use as directed.   Continue Hydroxyzine 25 mg  tablets at bedtime take 1-2. Caution sedation  If no better in 6 weeks we may consider discussing with PCP about changing some of the current prescriptions pt is taking that could be causing this rash   Other Related Medications clobetasol ointment (TEMOVATE) 0.05 % hydrOXYzine (ATARAX/VISTARIL)  25 MG tablet hydrOXYzine (ATARAX/VISTARIL) 25 MG tablet  >30 minutes was spent in the care of this patient today, more than 50% which was spent in counseling and coordination of care.   Return in about 1 month (around 05/12/2020) for Rash .  I, Marye Round, CMA, am acting as scribe for Forest Gleason, MD .  Documentation: I have reviewed the above documentation for accuracy and completeness, and I agree with the above.  Forest Gleason, MD

## 2020-04-14 DIAGNOSIS — M329 Systemic lupus erythematosus, unspecified: Secondary | ICD-10-CM | POA: Insufficient documentation

## 2020-05-09 ENCOUNTER — Encounter: Payer: Self-pay | Admitting: Dermatology

## 2020-05-09 ENCOUNTER — Ambulatory Visit: Payer: Managed Care, Other (non HMO) | Admitting: Dermatology

## 2020-05-09 ENCOUNTER — Telehealth: Payer: Self-pay

## 2020-05-09 ENCOUNTER — Other Ambulatory Visit: Payer: Self-pay

## 2020-05-09 DIAGNOSIS — R21 Rash and other nonspecific skin eruption: Secondary | ICD-10-CM

## 2020-05-09 DIAGNOSIS — L659 Nonscarring hair loss, unspecified: Secondary | ICD-10-CM

## 2020-05-09 MED ORDER — MUPIROCIN 2 % EX OINT: TOPICAL_OINTMENT | CUTANEOUS | 1 refills | Status: DC

## 2020-05-09 NOTE — Patient Instructions (Addendum)
Recommend minoxidil 5% (Rogaine for men) solution or foam to be applied to the scalp and left in. This should ideally be used twice daily for best results but it helps with hair regrowth when used at least three times per week. Rogaine initially can cause increased hair shedding for the first few weeks but this will stop with continued use. In studies, people who used minoxidil (Rogaine) for at least 6 months had thicker hair than people who did not. Minoxidil topical (Rogaine) only works as long as it continues to be used. If if it is no longer used then the hair it has been helping to regrow can fall out. Minoxidil topical (Rogaine) cause increased facial hair growth.     Start Mupirocin ointment 3 times a day to right arm for possible infection

## 2020-05-09 NOTE — Telephone Encounter (Signed)
Called PCP at [facility] office  Spoke to [place], discussed with Izora Gala we have faxed a release of Labs to your office and we have not received the labs, per Izora Gala she did not see a release we faxed, okay I faxed release note to fax [phone]

## 2020-05-09 NOTE — Progress Notes (Signed)
   Follow-Up Visit   Subjective  Marcia Maldonado is a 50 y.o. female who presents for the following: Follow-up (1 month f/u biopsy proven hypersensitive reaction, pt report skin much better after taking Prednisone, she finished Prednisone 2 weeks ago ). She has stayed off of doxycycline and supplements.   The following portions of the chart were reviewed this encounter and updated as appropriate:   Tobacco  Allergies  Meds  Problems  Med Hx  Surg Hx  Fam Hx      Review of Systems:  No other skin or systemic complaints except as noted in HPI or Assessment and Plan.  Objective  Well appearing patient in no apparent distress; mood and affect are within normal limits.  A focused examination was performed including face,arms,chest back . Relevant physical exam findings are noted in the Assessment and Plan.  Objective  Right Forearm - Anterior: Excoriations with pustules   Objective  Head - Anterior (Face): Diffuse thinning negative hair pull test   Images         Assessment & Plan  Rash Right Forearm - Anterior  Bacterial culture pending   Start Mupirocin ointment apply to arm 3 times a day   Call for any pain or spreading redness   Other Related Procedures Anaerobic and Aerobic Culture  Ordered Medications: mupirocin ointment (BACTROBAN) 2 %  Alopecia Head - Anterior (Face)  Previous biopsy showed interface dermatitis. We did not yet receive labs from PCP. Will call to request labs again. If ANA not previously performed, will check ANA with reflex.  Recommend minoxidil 5% (Rogaine for men) solution or foam to be applied to the scalp and left in. This should ideally be used twice daily for best results but it helps with hair regrowth when used at least three times per week. Rogaine initially can cause increased hair shedding for the first few weeks but this will stop with continued use. In studies, people who used minoxidil (Rogaine) for at least 6 months had  thicker hair than people who did not. Minoxidil topical (Rogaine) only works as long as it continues to be used. If if it is no longer used then the hair it has been helping to regrow can fall out. Minoxidil topical (Rogaine) can cause increased facial hair growth which can usually be managed easily with a battery-operated hair trimmer. If facial hair growth is bothersome, switching to the 2% women's version can decrease the risk of unwanted facial hair growth.   We will call PCP to get a copy of the most recent labs   Return in about 6 weeks (around 06/20/2020) for rash, alopecia .  I, Marye Round, CMA, am acting as scribe for Forest Gleason, MD .  Documentation: I have reviewed the above documentation for accuracy and completeness, and I agree with the above.  Forest Gleason, MD

## 2020-05-15 LAB — ANAEROBIC AND AEROBIC CULTURE

## 2020-05-15 LAB — SPECIMEN STATUS REPORT

## 2020-05-16 ENCOUNTER — Telehealth: Payer: Self-pay

## 2020-05-16 NOTE — Telephone Encounter (Signed)
Still do not see her lab results.  Can you please call again and ask that they fax them and also ask if they have performed an ANA test? If they have not, we can go ahead and order ANA with reflex.Thank you!

## 2020-05-16 NOTE — Telephone Encounter (Signed)
Called and informed patient of culture results. She verbalized understanding and denied further questions at this time.

## 2020-05-16 NOTE — Telephone Encounter (Signed)
-----   Message from Alfonso Patten, MD sent at 05/16/2020 10:02 AM EDT ----- Culture showed Small amount of normal skin bacteria --> No additional treatment needed.   Please advise patient we still have not received her labs from primary care but are calling again and will try to get the answer we need over the phone without having to wait for a fax.   MAs please call. Thank you!

## 2020-05-23 DIAGNOSIS — L659 Nonscarring hair loss, unspecified: Secondary | ICD-10-CM | POA: Insufficient documentation

## 2020-06-06 ENCOUNTER — Emergency Department: Payer: Managed Care, Other (non HMO)

## 2020-06-06 ENCOUNTER — Other Ambulatory Visit: Payer: Self-pay

## 2020-06-06 ENCOUNTER — Emergency Department
Admission: EM | Admit: 2020-06-06 | Discharge: 2020-06-06 | Disposition: A | Discharge status: Home / Self Care | Payer: Managed Care, Other (non HMO) | Attending: Emergency Medicine | Admitting: Emergency Medicine

## 2020-06-06 DIAGNOSIS — J45909 Unspecified asthma, uncomplicated: Secondary | ICD-10-CM | POA: Diagnosis not present

## 2020-06-06 DIAGNOSIS — M79605 Pain in left leg: Secondary | ICD-10-CM | POA: Diagnosis present

## 2020-06-06 DIAGNOSIS — R609 Edema, unspecified: Secondary | ICD-10-CM | POA: Diagnosis not present

## 2020-06-06 DIAGNOSIS — Z96652 Presence of left artificial knee joint: Secondary | ICD-10-CM | POA: Diagnosis not present

## 2020-06-06 DIAGNOSIS — Z79899 Other long term (current) drug therapy: Secondary | ICD-10-CM | POA: Diagnosis not present

## 2020-06-06 DIAGNOSIS — I10 Essential (primary) hypertension: Secondary | ICD-10-CM | POA: Diagnosis not present

## 2020-06-06 NOTE — ED Provider Notes (Signed)
[facility] Emergency Department Provider Note   ____________________________________________   Event Date/Time   First MD Initiated Contact with Patient 06/06/20 1334     (approximate)  I have reviewed the triage vital signs and the nursing notes.   HISTORY  Chief Complaint Leg Pain    HPI Marcia Maldonado is a 50 y.o. female with past medical history of asthma and hypertension who presents to the ED complaining of leg pain.  Patient reports that she recently drove down and back to [place], afterwards began to notice swelling in her left leg.  Swelling is primarily down near her ankle, she additionally has some pain behind her left knee.  She denies any recent trauma to her left leg.  She has not noticed any pain or swelling in her right leg.  She denies any associated chest pain or shortness of breath, she has never been diagnosed with DVT/PE in the past.        Past Medical History:  Diagnosis Date  . Asthma   . Atypical chest pain   . Costochondritis   . De Quervain's disease (tenosynovitis)   . Hypertension   . Migraine     Patient Active Problem List   Diagnosis Date Noted  . Syphilis 02/15/2020  . 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 Asthma 08/25/2013  . HTN (hypertension) 08/25/2013  . Fibroids 08/11/2013    Past Surgical History:  Procedure Laterality Date  . APPENDECTOMY    . BREAST BIOPSY Left    neg  . CHOLECYSTECTOMY    . ECTOPIC PREGNANCY SURGERY    . TOTAL KNEE ARTHROPLASTY Left 03/04/2019   Procedure: LEFT TOTAL KNEE ARTHROPLASTY;  Surgeon: Hessie Knows, MD;  Location: [facility] ORS;  Service: Orthopedics;  Laterality: Left;  . TOTAL LAPAROSCOPIC HYSTERECTOMY WITH SALPINGECTOMY      Prior to Admission medications   Medication Sig Start Date End Date Taking? Authorizing Provider  albuterol (PROVENTIL HFA;VENTOLIN HFA) 108 (90 BASE) MCG/ACT inhaler Inhale 2 puffs into  the lungs every 6 (six) hours as needed for shortness of breath. For SOB    [provider]  amLODipine (NORVASC) 10 MG tablet Take 10 mg by mouth daily.    [provider]  Clindamycin-Benzoyl Per, Refr, (DUAC) gel Apply to face qam 08/31/19   Moye, [place], MD  clobetasol (TEMOVATE) 0.05 % external solution Apply 1 application topically 2 (two) times daily. Avoid face, groin, axilla 12/01/19   Moye, [place], MD  clobetasol ointment (TEMOVATE) 0.05 % apply to affected areas of arms and legs twice a day for 2 weeks and after that use on weekends only. Avoid applying to face, groin, and axilla. Use as directed. 03/29/20   Moye, [place], MD  doxycycline (MONODOX) 100 MG capsule Take 1 capsule (100 mg total) by mouth 2 (two) times daily. Take with food and plenty of drink. 12/01/19   Moye, [place], MD  HYDROcodone-acetaminophen (NORCO) 7.5-325 MG tablet Take 1-2 tablets by mouth every 6 (six) hours as needed for moderate pain or severe pain. 03/05/19   Duanne Guess, PA-C  hydrOXYzine (ATARAX/VISTARIL) 25 MG tablet Take 1-2 tablets (25-50 mg total) by mouth at bedtime as needed. Caution drowsiness 03/29/20   Moye, [place], MD  hydrOXYzine (ATARAX/VISTARIL) 25 MG tablet Take 1-2 tablets at bedtime as needed for itch. Caution sedation risk. 04/04/20   Moye, [place], MD  labetalol (NORMODYNE) 200 MG tablet Take 200 mg by mouth 2 (  two) times daily.     [provider]  losartan-hydrochlorothiazide (HYZAAR) 50-12.5 MG tablet Take 1 tablet by mouth daily.    [provider]  mupirocin ointment (BACTROBAN) 2 % Apply to right arm 3 times a day 05/09/20   Laurence Ferrari, [place], MD  oxyCODONE-acetaminophen (PERCOCET/ROXICET) 5-325 MG tablet Take 1 tablet by mouth every 6 (six) hours as needed for severe pain. 03/10/19   Cuthriell, Charline Bills, PA-C  predniSONE (STERAPRED UNI-PAK 21 TAB) 10 MG (21) TBPK tablet Take 6 tablets the first day, take 5 tablets the second day, take 4 tablets  the third day, take 3 tablets the fourth day, take 2 tablets the fifth day, take 1 tablet the sixth day. 06/25/19   Lannie Fields, PA-C  promethazine-codeine (PHENERGAN WITH CODEINE) 6.25-10 MG/5ML syrup Take 1-2 teaspoons every 6 hours as needed for severe cough. May cause drowsiness. 01/08/18   Jacqulyn Cane, MD  SUMAtriptan (IMITREX) 25 MG tablet Take 25 mg by mouth every 2 (two) hours as needed for migraine. May repeat in 2 hours if headache persists or recurs.    [provider]  topiramate (TOPAMAX) 50 MG tablet Take 50 mg by mouth daily.    [provider]  tretinoin (RETIN-A) 0.025 % cream Apply nightly to face as directed. 12/01/19   Moye, [place], MD  triamcinolone ointment (KENALOG) 0.1 % Apply to aa's BID for 2-3 weeks. Avoid f/g/a. 02/10/20   Alfonso Patten, MD    Allergies Erythromycin, Phenylephrine-guaifenesin, Doxycycline, Guaifenesin, and Guaifenesin & derivatives  Family History  Problem Relation Age of Onset  . Breast cancer Mother 63  . Cancer Father   . Leukemia Brother 54       CLL    Social History Social History   Tobacco Use  . Smoking status: Never Smoker  . Smokeless tobacco: Never Used  Vaping Use  . Vaping Use: Never used  Substance Use Topics  . Alcohol use: Yes    Comment: sporadically  . Drug use: No    Review of Systems  Constitutional: No fever/chills Eyes: No visual changes. ENT: No sore throat. Cardiovascular: Denies chest pain. Respiratory: Denies shortness of breath. Gastrointestinal: No abdominal pain.  No nausea, no vomiting.  No diarrhea.  No constipation. Genitourinary: Negative for dysuria. Musculoskeletal: Negative for back pain.  Positive for left leg pain and swelling. Skin: Negative for rash. Neurological: Negative for headaches, focal weakness or numbness.  ____________________________________________   PHYSICAL EXAM:  VITAL SIGNS: ED Triage Vitals  Enc Vitals Group     BP 06/06/20 1218 (!)  148/94     Pulse Rate 06/06/20 1218 80     Resp 06/06/20 1218 18     Temp 06/06/20 1218 98.3 F (36.8 C)     Temp Source 06/06/20 1218 Oral     SpO2 06/06/20 1218 100 %     Weight --      Height --      Head Circumference --      Peak Flow --      Pain Score 06/06/20 1217 4     Pain Loc --      Pain Edu? --      Excl. in [place]? --     Constitutional: Alert and oriented. Eyes: Conjunctivae are normal. Head: Atraumatic. Nose: No congestion/rhinnorhea. Mouth/Throat: Mucous membranes are moist. Neck: Normal ROM Cardiovascular: Normal rate, regular rhythm. Grossly normal heart sounds. Respiratory: Normal respiratory effort.  No retractions. Lungs CTAB. Gastrointestinal: Soft and nontender. No distention.  Genitourinary: deferred Musculoskeletal: Trace pitting edema to distal left lower extremity.  No calf tenderness bilaterally.  Tenderness to palpation noted to posterior portion of left knee. Neurologic:  Normal speech and language. No gross focal neurologic deficits are appreciated. Skin:  Skin is warm, dry and intact. No rash noted. Psychiatric: Mood and affect are normal. Speech and behavior are normal.  ____________________________________________   LABS (all labs ordered are listed, but only abnormal results are displayed)  Labs Reviewed - No data to display   PROCEDURES  Procedure(s) performed (including Critical Care):  Procedures   ____________________________________________   INITIAL IMPRESSION / ASSESSMENT AND PLAN / ED COURSE       50 year old female with past medical history of hypertension and asthma who presents to the ED with left leg pain and swelling after driving back and forth to [place].  She is neurovascular intact to her left lower extremity with 2+ DP pulse.  There are no signs of cellulitis.  Ultrasound was performed and negative for DVT or other acute process.  Patient is appropriate for discharge home with PCP follow-up, was counseled to use  compression stocking and keep leg elevated.  She was counseled to return to the ED for new worsening symptoms, patient agrees with plan.      ____________________________________________   FINAL CLINICAL IMPRESSION(S) / ED DIAGNOSES  Final diagnoses:  Left leg pain  Peripheral edema     ED Discharge Orders    None       Note:  This document was prepared using Dragon voice recognition software and may include unintentional dictation errors.   Blake Divine, MD 06/06/20 1505

## 2020-06-06 NOTE — ED Notes (Signed)
See triage note, pt reports was told to come get evaluated for blood clot when noticed swelling to left ankle after trip to [place]. Denies injury. Denies hx of blood clots. Does not take blood thinners.

## 2020-06-06 NOTE — ED Triage Notes (Signed)
Pt come with c/o left leg swelling. Pt states recent trip down to [place]. Pt states she noticed the swelling on Saturday.

## 2020-06-15 ENCOUNTER — Telehealth: Payer: Self-pay | Admitting: Dermatology

## 2020-06-15 ENCOUNTER — Telehealth: Payer: Self-pay

## 2020-06-15 DIAGNOSIS — R21 Rash and other nonspecific skin eruption: Secondary | ICD-10-CM

## 2020-06-15 NOTE — Telephone Encounter (Signed)
Reviewed patient's labs from PCP. No ANA performed. Will recommend ANA panel with reflex (autoimmune bloodwork) given the biopsy results and persistent rash and hair loss.  MAs please call to review with patient and put lab slip for blood work at front desk for her. Thank you!

## 2020-06-15 NOTE — Telephone Encounter (Signed)
Left msg for patient to call concerning labs needed. Order for ANA left at front desk, JS

## 2020-06-19 NOTE — Addendum Note (Signed)
Addended by: Ruthell Rummage A on: 06/19/2020 04:45 PM   Modules accepted: Orders

## 2020-06-19 NOTE — Telephone Encounter (Signed)
Called patient and discussed need for ana panel with reflex given biopsy results and persistent rash and hair loss. Place future lab orders for patient and explained  patient will need to pick up lab slip at front desk. Patient states she will pick up lab slip tomorrow at front desk. She denies further questions at this time.

## 2020-06-19 NOTE — Addendum Note (Signed)
Addended by: Ruthell Rummage A on: 06/19/2020 04:40 PM   Modules accepted: Orders

## 2020-06-21 ENCOUNTER — Ambulatory Visit: Payer: Managed Care, Other (non HMO) | Admitting: Dermatology

## 2020-07-07 LAB — ANA,IFA RA DIAG PNL W/RFLX TIT/PATN
ANA Titer 1: NEGATIVE
Cyclic Citrullin Peptide Ab: 4 units (ref 0–19)
Rheumatoid fact SerPl-aCnc: <10 IU/mL (ref <14.0)

## 2020-07-12 ENCOUNTER — Telehealth: Payer: Self-pay

## 2020-07-12 NOTE — Telephone Encounter (Signed)
-----   Message from [place], MD sent at 07/11/2020 10:37 AM EDT ----- Autoimmune lab tests we ordered came back normal. Please see how patient is doing, if she has trouble with itch at her scalp or any weakness of her arms when brushing hair or weakness of legs when standing up.   MAs please call. Thank you!

## 2020-07-12 NOTE — Telephone Encounter (Signed)
Called patient and discussed provider's response to questions about redness at scalp. Patient was in agreement with restarting clobetasol solution to affected areas twice a day. She reports still having medication on hand. Patient advised to let us know if that did not work and we could prescribe other treatment. She denied further questions at this time.

## 2020-07-12 NOTE — Telephone Encounter (Signed)
Thank you :)

## 2020-07-12 NOTE — Telephone Encounter (Signed)
Recommend restarting clobetasol solution twice a day to affected areas. If not working, would recommend switch to clobetasol foam (available from [facility] for <$30 with goodrx coupon as of a few days ago).

## 2020-07-12 NOTE — Telephone Encounter (Signed)
Patient was called and advised of lab results. She states she is still having itchy at scalp and redness of crown. She does admit some occasional tingling in arms. She states she has had knee replacement so could not attribute leg weakness to this. She would like to know if there is anything she could use to treat redness at scalp.    Routing to provider to advise.

## 2020-12-25 ENCOUNTER — Telehealth: Payer: Self-pay

## 2020-12-25 NOTE — Telephone Encounter (Signed)
Magda Paganini at Dr Electa Sniff office at [facility] calling requesting a copy of this pts most recent pathology, faxed patholgy to [phone]

## 2021-01-30 ENCOUNTER — Other Ambulatory Visit: Payer: Self-pay

## 2021-01-30 ENCOUNTER — Ambulatory Visit: Payer: Managed Care, Other (non HMO) | Admitting: Dermatology

## 2021-01-30 DIAGNOSIS — R21 Rash and other nonspecific skin eruption: Secondary | ICD-10-CM

## 2021-01-30 DIAGNOSIS — L308 Other specified dermatitis: Secondary | ICD-10-CM | POA: Diagnosis not present

## 2021-01-30 NOTE — Patient Instructions (Addendum)

## 2021-01-30 NOTE — Progress Notes (Signed)
° °  Follow-Up Visit   Subjective  Marcia Maldonado is a 51 y.o. female who presents for the following: Rash (7 months f/u on itchy rash on her arms for several months, treating with Clobetasol ointment with a poor response ).  The following portions of the chart were reviewed this encounter and updated as appropriate:   Tobacco   Allergies   Meds   Problems   Med Hx   Surg Hx   Fam Hx       Review of Systems:  No other skin or systemic complaints except as noted in HPI or Assessment and Plan.  Objective  Well appearing patient in no apparent distress; mood and affect are within normal limits.  A focused examination was performed including arms,scalp. Relevant physical exam findings are noted in the Assessment and Plan.  scalp, arms, back Red papules and plaques                    Assessment & Plan  Rash scalp, arms, back  R/O Lupus vs Lichen planus >> Syphilis  Pending results will plan therapy, refer to rheumatology or, if this suggest hypersensitivity again, would refer to [facility] for further management  Skin / nail biopsy - scalp, arms, back Type of biopsy: punch   Informed consent: discussed and consent obtained   Patient was prepped and draped in usual sterile fashion: Area prepped with alcohol. Anesthesia: the lesion was anesthetized in a standard fashion   Anesthetic:  1% lidocaine w/ epinephrine 1-100,000 buffered w/ 8.4% NaHCO3 Punch size:  4 mm Suture size:  4-0 Suture type: nylon   Suture removal (days):  14 Hemostasis achieved with: suture and pressure   Outcome: patient tolerated procedure well   Post-procedure details: wound care instructions given   Post-procedure details comment:  Ointment and small bandage applied  Specimen 1 - Surgical pathology Differential Diagnosis: R/O Lupus vs Lichen planus vs Syphilis   Check Margins: No   Return in about 2 weeks (around 02/13/2021) for suture removal .  I, Marye Round, CMA, am acting as scribe  for Forest Gleason, MD .   Documentation: I have reviewed the above documentation for accuracy and completeness, and I agree with the above.  Forest Gleason, MD

## 2021-02-05 ENCOUNTER — Telehealth: Payer: Self-pay

## 2021-02-05 NOTE — Telephone Encounter (Signed)
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   Recommend referral to rheumatology (autoimmune specialists) for evaluation to be sure they are not suspicious for systemic lupus or other autoimmune process.   Recommend strict sun protection as that is known to flare up lupus.   Recommend starting hydroxychloroquine, a pill medicine that is often used for lupus. Would need to get an eye exam (baseline retinal exam from ophthalmoogist - can be done at [facility]), an EKG heart tracing test (would ask her PCP if they can do it), and blood work before starting. We do blood work every 6 months while on treatment. Most people feel well on this medicine and do well on this medicine. There is a rare risk of eye damage in people who have used the medicine for over 5 years. If she is in agreement, we will go ahead and start the process for this. Otherwise, we can discuss and I can answer questions at her suture removal.   MAs please call. Thank you!

## 2021-02-05 NOTE — Telephone Encounter (Signed)
Patient advised of BX results. She is currently seeing a rheumatologist at [facility] who sent her to see you regarding the rash/plaque. She is scheduled to see you next week so she said she would like to talk again and go over the hydroxychloroquine and she is scheduled for a June follow up with rheumatologist as of right now.

## 2021-02-09 ENCOUNTER — Encounter: Payer: Self-pay | Admitting: Dermatology

## 2021-02-13 ENCOUNTER — Other Ambulatory Visit: Payer: Self-pay

## 2021-02-13 ENCOUNTER — Ambulatory Visit (INDEPENDENT_AMBULATORY_CARE_PROVIDER_SITE_OTHER): Payer: Managed Care, Other (non HMO) | Admitting: Dermatology

## 2021-02-13 DIAGNOSIS — L308 Other specified dermatitis: Secondary | ICD-10-CM | POA: Diagnosis not present

## 2021-02-13 DIAGNOSIS — Z79899 Other long term (current) drug therapy: Secondary | ICD-10-CM | POA: Diagnosis not present

## 2021-02-13 DIAGNOSIS — L932 Other local lupus erythematosus: Secondary | ICD-10-CM

## 2021-02-13 DIAGNOSIS — Z4802 Encounter for removal of sutures: Secondary | ICD-10-CM

## 2021-02-13 MED ORDER — CLOBETASOL PROPIONATE 0.05 % EX FOAM: CUTANEOUS | 3 refills | Status: DC

## 2021-02-13 NOTE — Patient Instructions (Addendum)
Recommend daily broad spectrum sunscreen SPF 30+ to sun-exposed areas, reapply every 2 hours as needed. Call for new or changing lesions.  Staying in the shade or wearing long sleeves, sun glasses (UVA+UVB protection) and wide brim hats (4-inch brim around the entire circumference of the hat) are also recommended for sun protection.    Recommend taking Heliocare sun protection supplement daily in sunny weather for additional sun protection. For maximum protection on the sunniest days, you can take up to 2 capsules of regular Heliocare OR take 1 capsule of Heliocare Ultra. For prolonged exposure (such as a full day in the sun), you can repeat your dose of the supplement 4 hours after your first dose. Heliocare can be purchased at [employer], at some [facility] or at [email or web address].     If You Need Anything After Your Visit  If you have any questions or concerns for your doctor, please call our main line at [phone] and press option 4 to reach your doctor's medical assistant. If no one answers, please leave a voicemail as directed and we will return your call as soon as possible. Messages left after 4 pm will be answered the following business day.   You may also send [place] a message via [place]. We typically respond to MyChart messages within 1-2 business days.  For prescription refills, please ask your pharmacy to contact our office. Our fax number is [phone].  If you have an urgent issue when the clinic is closed that cannot wait until the next business day, you can page your doctor at the number below.    Please note that while we do our best to be available for urgent issues outside of office hours, we are not available 24/7.   If you have an urgent issue and are unable to reach [place], you may choose to seek medical care at your doctor's office, retail clinic, urgent care center, or emergency room.  If you have a medical emergency, please immediately call 911 or go to the  emergency department.  Pager Numbers  - Dr. Nehemiah Massed: [phone]  - Dr. Laurence Ferrari: [phone]  - Dr. Nicole Kindred: [phone]  In the event of inclement weather, please call our main line at [phone] for an update on the status of any delays or closures.  Dermatology Medication Tips: Please keep the boxes that topical medications come in in order to help keep track of the instructions about where and how to use these. Pharmacies typically print the medication instructions only on the boxes and not directly on the medication tubes.   If your medication is too expensive, please contact our office at [phone] option 4 or send [place] a message through [place].   We are unable to tell what your co-pay for medications will be in advance as this is different depending on your insurance coverage. However, we may be able to find a substitute medication at lower cost or fill out paperwork to get insurance to cover a needed medication.   If a prior authorization is required to get your medication covered by your insurance company, please allow [place] 1-2 business days to complete this process.  Drug prices often vary depending on where the prescription is filled and some pharmacies may offer cheaper prices.  The website www.goodrx.com contains coupons for medications through different pharmacies. The prices here do not account for what the cost may be with help from insurance (it may be cheaper with your insurance), but the website can give you the price if  you did not use any insurance.  - You can print the associated coupon and take it with your prescription to the pharmacy.  - You may also stop by our office during regular business hours and pick up a GoodRx coupon card.  - If you need your prescription sent electronically to a different pharmacy, notify our office through [facility] or by phone at [phone] option 4.     Si Usted Necesita Algo Despus de Su Visita  Tambin puede  enviarnos un mensaje a travs de Pharmacist, community. Por lo general respondemos a los mensajes de MyChart en el transcurso de 1 a 2 das hbiles.  Para renovar recetas, por favor pida a su farmacia que se ponga en contacto con nuestra oficina. Harland Dingwall de fax es [place] [phone].  Si tiene un asunto urgente cuando la clnica est cerrada y que no puede esperar hasta el siguiente da hbil, puede llamar/localizar a su doctor(a) al nmero que aparece a continuacin.   Por favor, tenga en cuenta que aunque hacemos todo lo posible para estar disponibles para asuntos urgentes fuera del horario de [place], no estamos disponibles las 24 horas del da, los 7 das de la [place].   Si tiene un problema urgente y no puede comunicarse con nosotros, puede optar por buscar atencin mdica  en el consultorio de su doctor(a), en una clnica privada, en un centro de atencin urgente o en una sala de emergencias.  Si tiene Engineering geologist, por favor llame inmediatamente al 911 o vaya a la sala de emergencias.  Nmeros de bper  - Dr. Nehemiah Massed: [phone]  - Dra. Moye: [phone]  - Dra. Nicole Kindred: [phone]  En caso de inclemencias del [place], por favor llame a Johnsie Kindred principal al [phone] para una actualizacin sobre el [place] de cualquier retraso o cierre.  Consejos para la medicacin en dermatologa: Por favor, guarde las cajas en las que vienen los medicamentos de uso tpico para ayudarle a seguir las instrucciones sobre dnde y cmo usarlos. Las farmacias generalmente imprimen las instrucciones del medicamento slo en las cajas y no directamente en los tubos del [place].   Si su medicamento es muy caro, por favor, pngase en contacto con Zigmund Daniel llamando al [phone] y presione la opcin 4 o envenos un mensaje a travs de Pharmacist, community.   No podemos decirle cul ser su copago por los medicamentos por adelantado ya que esto es diferente dependiendo de la cobertura de su seguro.  Sin embargo, es posible que podamos encontrar un medicamento sustituto a Electrical engineer un formulario para que el seguro cubra el medicamento que se considera necesario.   Si se requiere una autorizacin previa para que su compaa de seguros [place] su medicamento, por favor permtanos de 1 a 2 das hbiles para completar este proceso.  Los precios de los medicamentos varan con frecuencia dependiendo del Environmental consultant de dnde se surte la receta y alguna farmacias pueden ofrecer precios ms baratos.  El sitio web www.goodrx.com tiene cupones para medicamentos de Airline pilot. Los precios aqu no tienen en cuenta lo que podra costar con la ayuda del seguro (puede ser ms barato con su seguro), pero el sitio web puede darle el precio si no utiliz Research scientist (physical sciences).  - Puede imprimir el cupn correspondiente y llevarlo con su receta a la farmacia.  - Tambin puede pasar por nuestra oficina durante el horario de atencin regular y Charity fundraiser una tarjeta de cupones de GoodRx.  - Si necesita que su receta  se enve electrnicamente a una farmacia diferente, informe a nuestra oficina a travs de MyChart de [employer] o por telfono llamando al [phone] y presione la opcin 4.

## 2021-02-13 NOTE — Progress Notes (Signed)
° °  Follow-Up Visit   Subjective  Marcia Maldonado is a 51 y.o. female who presents for the following: Follow-up (2 week suture removal. Pt states that she has not talked with rheumatology yet about starting hydroxychloroquine. ).  Rash is persistent, bothersome.   She is scheduled for rheumatology follow-up in June.   The following portions of the chart were reviewed this encounter and updated as appropriate:  Tobacco   Allergies   Meds   Problems   Med Hx   Surg Hx   Fam Hx       Review of Systems: No other skin or systemic complaints except as noted in HPI or Assessment and Plan.   Objective  Well appearing patient in no apparent distress; mood and affect are within normal limits.  A focused examination was performed including scalp and arms. Relevant physical exam findings are noted in the Assessment and Plan.  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 Previous ANA negative.  She has failed topical clobetasol.   Recommend starting hydroxychloroquine. Will order labs, review EKG, refer for baseline retinal exam with [facility]  Recommend strict photoprotection.   Hydroxychloroquine (plaquenil) can rarely cause irreversible damage to the retina of the eye if used for a long period of time. This damage can be identified and the medication stopped before vision changes are noticed by going for a yearly ophthalmology exam. Rarely, this medicine can cause low blood counts, liver inflammation, and/or a color change of the skin.  The use of Plaquenil requires long term medication management, including periodic office visits and monitoring of blood work.  Continue Clobetasol foam to scalp and arms BID for up to 2 weeks at a time.   Will  consider adding tacrolimus in 2 weeks if clobetasol not helping at arms. Pt will call.   Will order referral to [facility] dermatology for second opinion.   Labs ordered today.   Chronic and persistent condition with duration or expected duration over one year. Condition is bothersome/symptomatic for patient. Currently flared.   CBC with Differential/Platelet - Left Forearm - Anterior  Comprehensive metabolic panel - Left Forearm - Anterior  clobetasol (OLUX) 0.05 % topical foam - Left Forearm - Anterior Apply to scalp and arms twice a day for up to 2 weeks at a time.  Sedimentation Rate - Left Forearm - Anterior  Histone antibodies, IgG, blood - Left Forearm - Anterior  Sjogren's syndrome antibods(ssa + ssb) - Left Forearm - Anterior  Urinalysis - Left Forearm - Anterior  Encounter for removal of sutures Left Forearm - Anterior  Encounter for Removal of Sutures - Incision site at the left arm is clean, dry and intact - Wound cleansed, sutures removed, wound cleansed.  - Discussed pathology results showing INTERFACE DERMATITIS   - Scars remodel for a full year. - Patient advised to call with any concerns or if they notice any new or changing lesions.    Return in about 4 months (around 06/13/2021) for 3-4 mo f/u.  I, Harriett Sine, CMA, am acting as scribe for Forest Gleason, MD.  Documentation: I have reviewed the above documentation for accuracy and completeness, and I agree with the above.  Forest Gleason, MD

## 2021-02-21 ENCOUNTER — Telehealth: Payer: Self-pay | Admitting: Dermatology

## 2021-02-21 ENCOUNTER — Other Ambulatory Visit: Payer: Self-pay

## 2021-02-21 ENCOUNTER — Encounter: Payer: Self-pay | Admitting: Dermatology

## 2021-02-21 DIAGNOSIS — L932 Other local lupus erythematosus: Secondary | ICD-10-CM

## 2021-02-21 NOTE — Telephone Encounter (Signed)
Called patient to follow-up on lab work. She did not see patient instructions message. She will stop by to get the additional lab slips.   Reviewed previous cardiology note and EKG with no evidence of long QT.  Discussed she is on multiple medications which can cause SCLE (hydrochlorothiazide, amlodipine, ARB, beta blocker) so may want to try moving off of all potentially inciting medications in future.

## 2021-02-22 ENCOUNTER — Telehealth: Payer: Self-pay

## 2021-02-22 NOTE — Telephone Encounter (Signed)
Patient's referral sent 02/21/2021 to Dr. Annie Sable at [facility] for 2nd opinion. Was not able to get referral to go through to [facility]. Tried to fax multiple times without success. Mailed referral instead.  Lurlean Horns., RMA

## 2021-02-23 IMAGING — US US PELVIS COMPLETE
1 series · 14 of 25 positions shown · non-contrast
Comparison: None

CLINICAL DATA: Sudden left lower quadrant pain.



[Series 1: us pelvis complete · 14 of 55 slices shown]
[im 1/55]
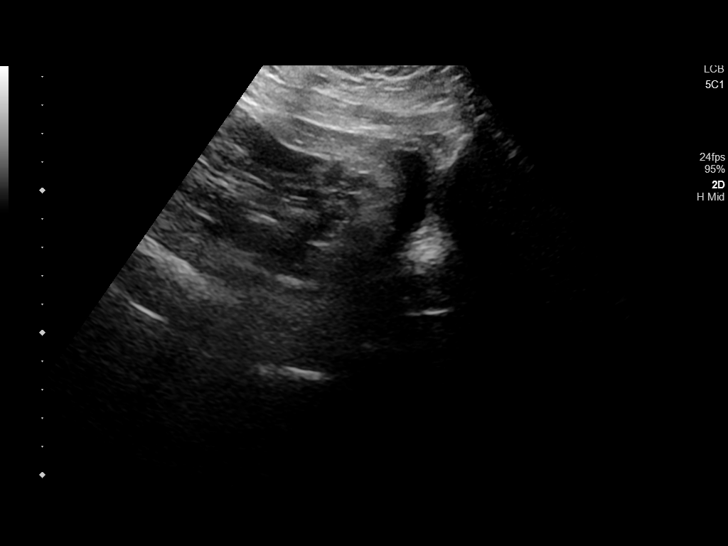
[im 5/55]
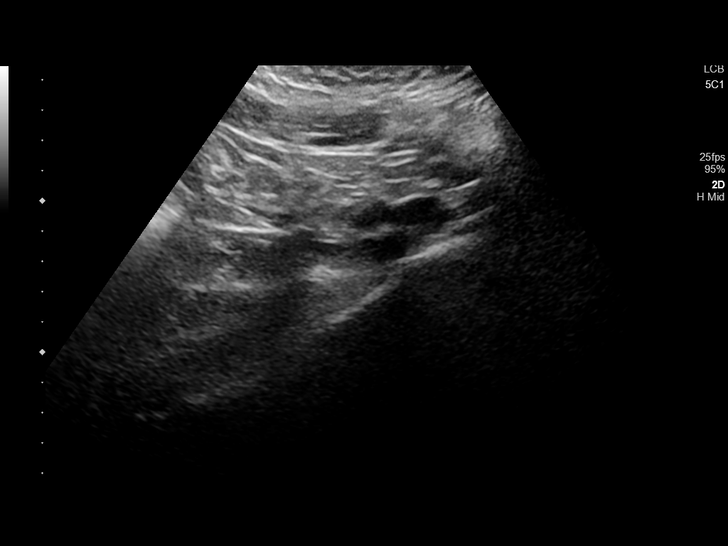
[im 10/55]
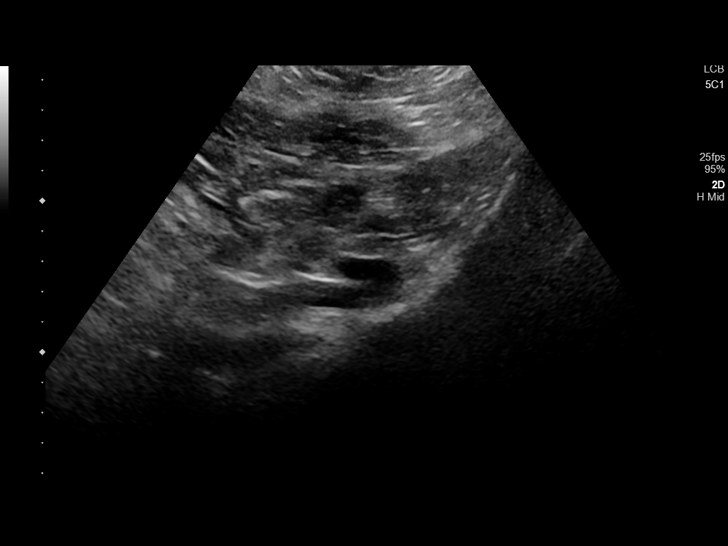
[im 14/55]
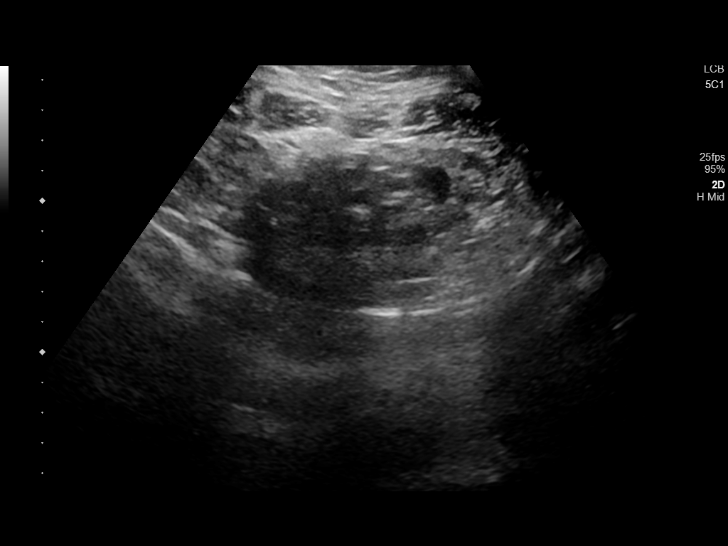
[im 19/55]
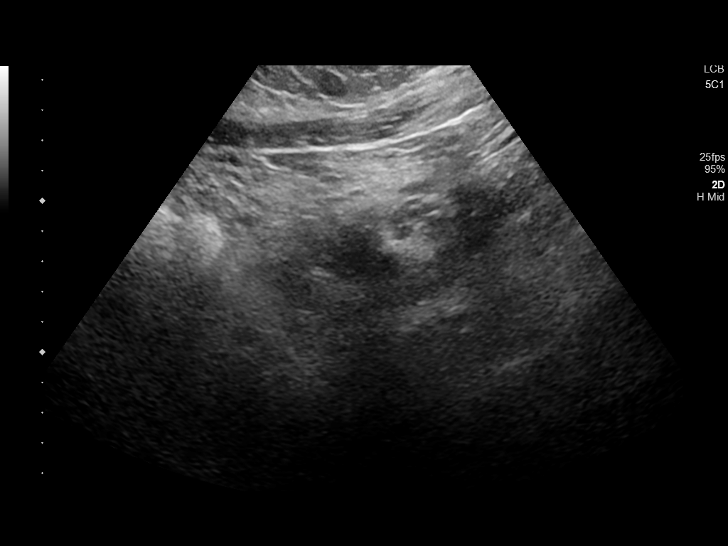
[im 21/55]
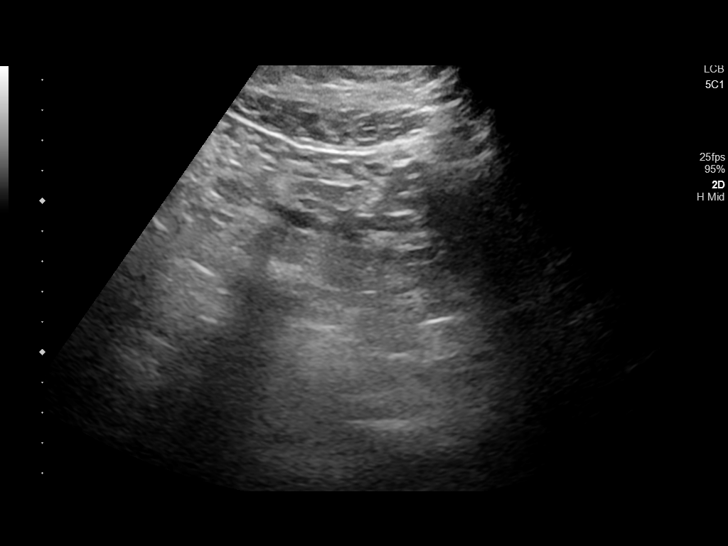
[im 25/55]
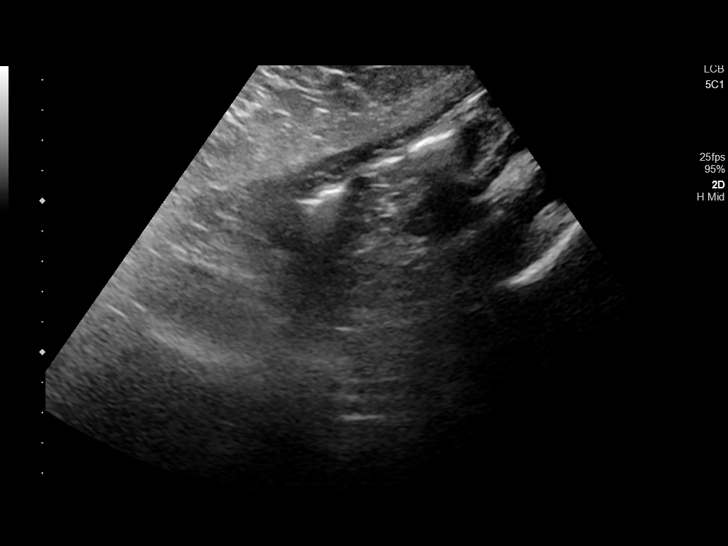
[im 30/55]
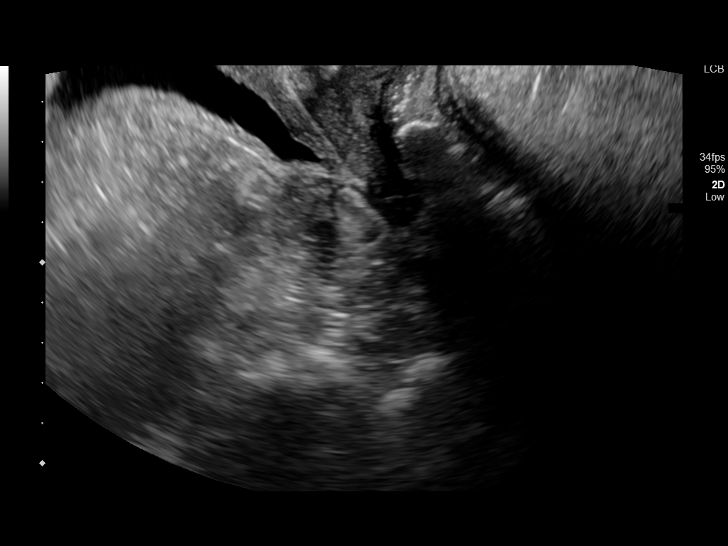
[im 34/55]
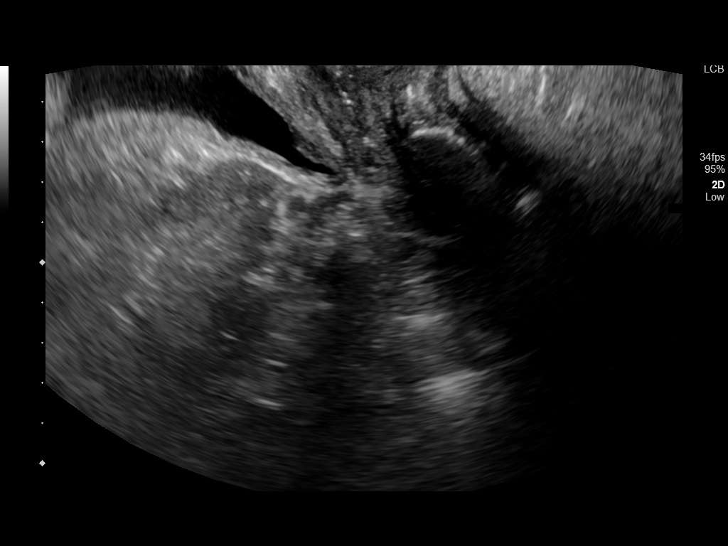
[im 37/55]
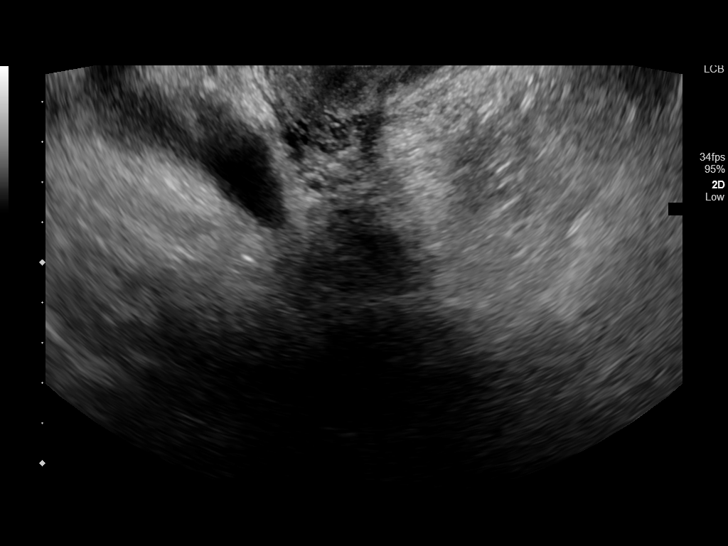
[im 41/55]
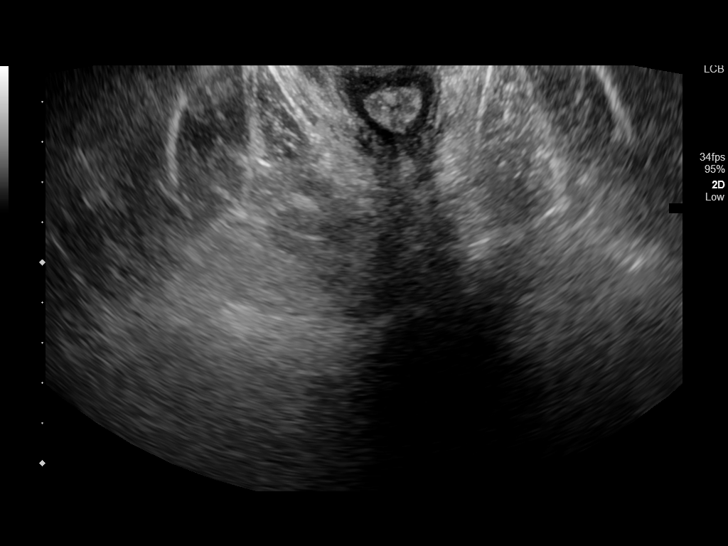
[im 46/55]
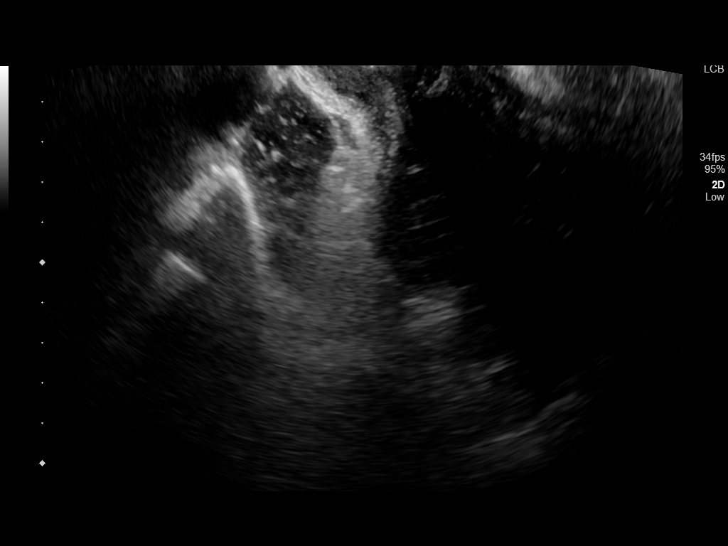
[im 50/55]
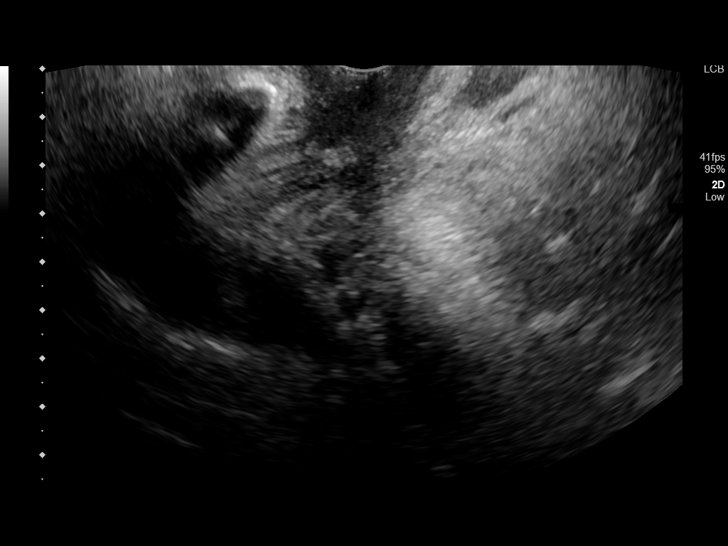
[im 55/55]
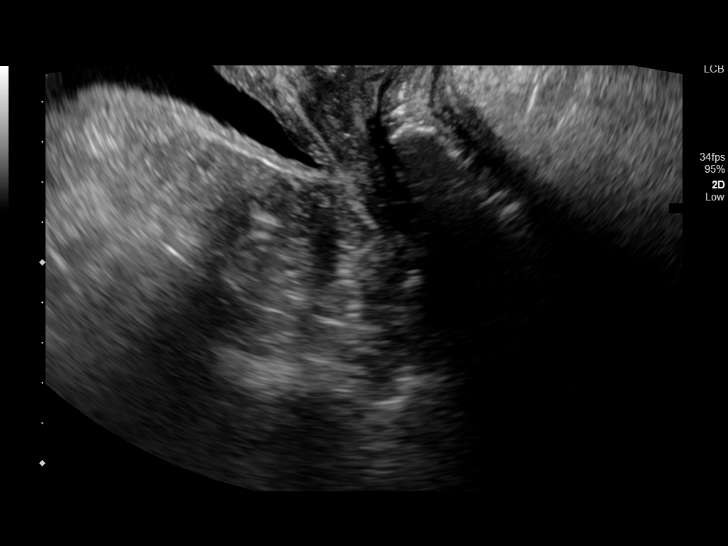

[14 of 25 positions shown; findings below may reference images not displayed]

FINDINGS: Uterus

Surgically absent.

Right ovary

Not visualized.

Left ovary

Not visualized.

Other findings

No abnormal free fluid.
IMPRESSION: The patient is status post hysterectomy. Neither ovary was
visualized. No cause for pain noted.

## 2021-02-24 LAB — CBC WITH DIFFERENTIAL/PLATELET
Basophils Absolute: 0 10*3/uL (ref 0.0–0.2)
Basos: 1 %
EOS (ABSOLUTE): 0.1 10*3/uL (ref 0.0–0.4)
Eos: 2 %
Hematocrit: 40.8 % (ref 34.0–46.6)
Hemoglobin: 14 g/dL (ref 11.1–15.9)
Immature Grans (Abs): 0 10*3/uL (ref 0.0–0.1)
Immature Granulocytes: 0 %
Lymphocytes Absolute: 0.8 10*3/uL (ref 0.7–3.1)
Lymphs: 22 %
MCH: 31 pg (ref 26.6–33.0)
MCHC: 34.3 g/dL (ref 31.5–35.7)
MCV: 90 fL (ref 79–97)
Monocytes Absolute: 0.6 10*3/uL (ref 0.1–0.9)
Monocytes: 16 %
Neutrophils Absolute: 2.2 10*3/uL (ref 1.4–7.0)
Neutrophils: 59 %
Platelets: 150 10*3/uL (ref 150–450)
RBC: 4.52 x10E6/uL (ref 3.77–5.28)
RDW: 13.6 % (ref 11.7–15.4)
WBC: 3.7 10*3/uL (ref 3.4–10.8)

## 2021-02-24 LAB — COMPREHENSIVE METABOLIC PANEL
ALT: 31 IU/L (ref 0–32)
AST: 32 IU/L (ref 0–40)
Albumin/Globulin Ratio: 1.5 (ref 1.2–2.2)
Albumin: 4.3 g/dL (ref 3.8–4.9)
Alkaline Phosphatase: 82 IU/L (ref 44–121)
BUN/Creatinine Ratio: 14 (ref 9–23)
BUN: 13 mg/dL (ref 6–24)
Bilirubin Total: 0.5 mg/dL (ref 0.0–1.2)
CO2: 24 mmol/L (ref 20–29)
Calcium: 9 mg/dL (ref 8.7–10.2)
Chloride: 106 mmol/L (ref 96–106)
Creatinine, Ser: 0.91 mg/dL (ref 0.57–1.00)
Globulin, Total: 2.8 g/dL (ref 1.5–4.5)
Glucose: 87 mg/dL (ref 70–99)
Potassium: 4.1 mmol/L (ref 3.5–5.2)
Sodium: 144 mmol/L (ref 134–144)
Total Protein: 7.1 g/dL (ref 6.0–8.5)
eGFR: 76 mL/min/{1.73_m2} (ref >59)

## 2021-02-24 LAB — URINALYSIS
Bilirubin, UA: NEGATIVE
Glucose, UA: NEGATIVE
Ketones, UA: NEGATIVE
Leukocytes,UA: NEGATIVE
Nitrite, UA: NEGATIVE
RBC, UA: NEGATIVE
Specific Gravity, UA: 1.026 (ref 1.005–1.030)
Urobilinogen, Ur: 1 mg/dL (ref 0.2–1.0)
pH, UA: 6 (ref 5.0–7.5)

## 2021-02-24 LAB — SJOGREN'S SYNDROME ANTIBODS(SSA + SSB)
ENA SSA (RO) Ab: 2.9 AI — ABNORMAL HIGH (ref 0.0–0.9)
ENA SSB (LA) Ab: <0.2 AI (ref 0.0–0.9)

## 2021-02-24 LAB — SEDIMENTATION RATE: Sed Rate: 2 mm/hr (ref 0–40)

## 2021-02-24 LAB — HISTONE ANTIBODIES, IGG, BLOOD: Histone Ab: 1.5 Units — ABNORMAL HIGH (ref 0.0–0.9)

## 2021-02-28 ENCOUNTER — Telehealth: Payer: Self-pay

## 2021-02-28 NOTE — Telephone Encounter (Signed)
Yes, she can take a multivitamin and B12. Thank you!

## 2021-02-28 NOTE — Telephone Encounter (Signed)
Patient called asking if she can continue back with a multivitamin or vitamin B12?

## 2021-03-01 NOTE — Telephone Encounter (Signed)
Left detailed message for patient OK to take multivitamin and B12. aw

## 2021-03-02 ENCOUNTER — Telehealth: Payer: Self-pay | Admitting: Dermatology

## 2021-03-02 NOTE — Telephone Encounter (Signed)
Reviewed patient's medications for potential causes of drug-induced SCLE. The most likely causative medications include: HCTZ or amlodipine.   Doxycycline has also been linked to diSCLE but she has not taken this since late 2021/early 2022 and condition has persisted and worsened. Rash appeared in early 2022.  SCLE has been reported due to beta-blockers but not labetalol specifically, due to antihistamines but not hydroxyzine specifically, and due to an ARB but not losartan specifically.  Spoke with PCP office and they will discuss stopping HCTZ, amlodipine (and avoiding ACE-Is) with Dr. Lennox Grumbles when she returns to the office next week.

## 2021-03-02 NOTE — Telephone Encounter (Signed)
Also called Dr. Crissie Figures office [facility] rheumatology) to see if they would like to see her sooner than June given our additional findings. Left a message and am awaiting return call.

## 2021-03-05 ENCOUNTER — Other Ambulatory Visit: Payer: Self-pay

## 2021-03-05 MED ORDER — HYDROXYCHLOROQUINE SULFATE 200 MG PO TABS: 200.0000 mg | ORAL_TABLET | Freq: Two times a day (BID) | ORAL | 1 refills | Status: DC

## 2021-03-05 NOTE — Telephone Encounter (Signed)
Pt called, her rx for hydroxychloroquine was not called into the pharmacy, of erx' d  start hydroxychloroquine 400 mg daily (as she has failed topical steroids) and plan repeat labs in 6 months

## 2021-03-14 NOTE — Telephone Encounter (Signed)
Called Dr. Crissie Figures office. Patient's appointment has been changed to April 04, 2021 at 10:30 per Dr. Robbi Garter recommendations. JP ?

## 2021-03-14 NOTE — Telephone Encounter (Signed)
Thank you :)

## 2021-03-14 NOTE — Telephone Encounter (Signed)
Can you please call [facility] Rhuematology Dr. 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 I have not heard back from them still. Thank you! ?

## 2021-06-05 ENCOUNTER — Ambulatory Visit: Payer: Managed Care, Other (non HMO) | Admitting: Dermatology

## 2022-08-05 DIAGNOSIS — G5601 Carpal tunnel syndrome, right upper limb: Secondary | ICD-10-CM | POA: Insufficient documentation

## 2022-08-19 DIAGNOSIS — G43909 Migraine, unspecified, not intractable, without status migrainosus: Secondary | ICD-10-CM | POA: Insufficient documentation

## 2023-01-28 ENCOUNTER — Ambulatory Visit (INDEPENDENT_AMBULATORY_CARE_PROVIDER_SITE_OTHER): Payer: Managed Care, Other (non HMO) | Admitting: Dermatology

## 2023-01-28 ENCOUNTER — Encounter: Payer: Self-pay | Admitting: Dermatology

## 2023-01-28 DIAGNOSIS — L930 Discoid lupus erythematosus: Secondary | ICD-10-CM | POA: Diagnosis not present

## 2023-01-28 DIAGNOSIS — R21 Rash and other nonspecific skin eruption: Secondary | ICD-10-CM

## 2023-01-28 MED ORDER — CLOBETASOL PROPIONATE 0.05 % EX CREA: 1.0000 | TOPICAL_CREAM | Freq: Two times a day (BID) | CUTANEOUS | 1 refills | Status: DC

## 2023-01-28 NOTE — Patient Instructions (Addendum)
 Treatment Plan: Start clobetasol  0.05% cream twice daily until area is smooth. Avoid applying to face, groin, and axilla. Use as directed. Long-term use can cause thinning of the skin.  Topical steroids (such as triamcinolone , fluocinolone, fluocinonide, mometasone, clobetasol , halobetasol, betamethasone, hydrocortisone) can cause thinning and lightening of the skin if they are used for too long in the same area. Your physician has selected the right strength medicine for your problem and area affected on the body. Please use your medication only as directed by your physician to prevent side effects.    Due to recent changes in healthcare laws, you may see results of your pathology and/or laboratory studies on MyChart before the doctors have had a chance to review them. We understand that in some cases there may be results that are confusing or concerning to you. Please understand that not all results are received at the same time and often the doctors may need to interpret multiple results in order to provide you with the best plan of care or course of treatment. Therefore, we ask that you please give us  2 business days to thoroughly review all your results before contacting the office for clarification. Should we see a critical lab result, you will be contacted sooner.   If You Need Anything After Your Visit  If you have any questions or concerns for your doctor, please call our main line at [phone] and press option 4 to reach your doctor's medical assistant. If no one answers, please leave a voicemail as directed and we will return your call as soon as possible. Messages left after 4 pm will be answered the following business day.   You may also send us  a message via MyChart. We typically respond to MyChart messages within 1-2 business days.  For prescription refills, please ask your pharmacy to contact our office. Our fax number is [phone].  If you have an urgent issue when the clinic  is closed that cannot wait until the next business day, you can page your doctor at the number below.    Please note that while we do our best to be available for urgent issues outside of office hours, we are not available 24/7.   If you have an urgent issue and are unable to reach us , you may choose to seek medical care at your doctor's office, retail clinic, urgent care center, or emergency room.  If you have a medical emergency, please immediately call 911 or go to the emergency department.  Pager Numbers  - Dr. Hester: [phone]  - Dr. Jackquline: [phone]  - Dr. Claudene: [phone]   In the event of inclement weather, please call our main line at [phone] for an update on the status of any delays or closures.  Dermatology Medication Tips: Please keep the boxes that topical medications come in in order to help keep track of the instructions about where and how to use these. Pharmacies typically print the medication instructions only on the boxes and not directly on the medication tubes.   If your medication is too expensive, please contact our office at [phone] option 4 or send us  a message through MyChart.   We are unable to tell what your co-pay for medications will be in advance as this is different depending on your insurance coverage. However, we may be able to find a substitute medication at lower cost or fill out paperwork to get insurance to cover a needed medication.   If a prior authorization is required to get  your medication covered by your insurance company, please allow us  1-2 business days to complete this process.  Drug prices often vary depending on where the prescription is filled and some pharmacies may offer cheaper prices.  The website www.goodrx.com contains coupons for medications through different pharmacies. The prices here do not account for what the cost may be with help from insurance (it may be cheaper with your insurance), but the website  can give you the price if you did not use any insurance.  - You can print the associated coupon and take it with your prescription to the pharmacy.  - You may also stop by our office during regular business hours and pick up a GoodRx coupon card.  - If you need your prescription sent electronically to a different pharmacy, notify our office through [facility] or by phone at [phone] option 4.     Si Usted Necesita Algo Despus de Su Visita  Tambin puede enviarnos un mensaje a travs de Clinical Cytogeneticist. Por lo general respondemos a los mensajes de MyChart en el transcurso de 1 a 2 das hbiles.  Para renovar recetas, por favor pida a su farmacia que se ponga en contacto con nuestra oficina. Randi lakes de fax es [place] [phone].  Si tiene un asunto urgente cuando la clnica est cerrada y que no puede esperar hasta el siguiente da hbil, puede llamar/localizar a su doctor(a) al nmero que aparece a continuacin.   Por favor, tenga en cuenta que aunque hacemos todo lo posible para estar disponibles para asuntos urgentes fuera del horario de [place], no estamos disponibles las 24 horas del da, los 7 [address]  [address] de la [place].   Si tiene un problema urgente y no puede comunicarse con nosotros, puede optar por buscar atencin mdica  en el consultorio de su doctor(a), en una clnica privada, en un centro de atencin urgente o en una sala de emergencias.  Si tiene engineer, drilling, por favor llame inmediatamente al 911 o vaya a la sala de emergencias.  Nmeros de bper  - Dr. Hester: [phone]  - Dra. Jackquline: [phone]  - Dr. Claudene: [phone]   En caso de inclemencias del tiempo, por favor llame a landry capes principal al [phone] para una actualizacin sobre el [place] de cualquier retraso o cierre.  Consejos para la medicacin en dermatologa: Por favor, guarde las cajas en las que vienen los medicamentos de uso tpico para ayudarle a seguir las instrucciones  sobre dnde y cmo usarlos. Las farmacias generalmente imprimen las instrucciones del medicamento slo en las cajas y no directamente en los tubos del [place].   Si su medicamento es muy caro, por favor, pngase en contacto con landry rieger llamando al [phone] y presione la opcin 4 o envenos un mensaje a travs de Clinical Cytogeneticist.   No podemos decirle cul ser su copago por los medicamentos por adelantado ya que esto es diferente dependiendo de la cobertura de su seguro. Sin embargo, es posible que podamos encontrar un medicamento sustituto a audiological scientist un formulario para que el seguro cubra el medicamento que se considera necesario.   Si se requiere una autorizacin previa para que su compaa de seguros cubra su medicamento, por favor permtanos de 1 a 2 das hbiles para completar este proceso.  Los precios de los medicamentos varan con frecuencia dependiendo del environmental consultant de dnde se surte la receta y alguna farmacias pueden ofrecer precios ms baratos.  El sitio web www.goodrx.com tiene cupones para medicamentos de health and safety inspector.  Los precios aqu no tienen en cuenta lo que podra costar con la ayuda del seguro (puede ser ms barato con su seguro), pero el sitio web puede darle el precio si no utiliz tourist information centre manager.  - Puede imprimir el cupn correspondiente y llevarlo con su receta a la farmacia.  - Tambin puede pasar por nuestra oficina durante el horario de atencin regular y education officer, museum una tarjeta de cupones de GoodRx.  - Si necesita que su receta se enve electrnicamente a una farmacia diferente, informe a nuestra oficina a travs de MyChart de [place] o por telfono llamando al [phone] y presione la opcin 4.

## 2023-01-28 NOTE — Progress Notes (Signed)
   Follow-Up Visit   Subjective  Marcia Maldonado is a 53 y.o. female who presents for the following: 2 spots at left breast. Started out like watery blister about 2 weeks ago. Patient treated with hydrogen peroxide and band-aids. Areas dried up but spots are still there and patient has developed pain in the area about 3 days ago. Patient seeing rheumatologist at [facility] for systemic lupus, currently on methotrexate. .   The following portions of the chart were reviewed this encounter and updated as appropriate: medications, allergies, medical history  Review of Systems:  No other skin or systemic complaints except as noted in HPI or Assessment and Plan.  Objective  Well appearing patient in no apparent distress; mood and affect are within normal limits.   A focused examination was performed of the following areas: b  Relevant exam findings are noted in the Assessment and Plan.    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 Avoid applying to face, groin, and axilla. Use as directed. Long-term use can cause thinning of the skin.  Patient advised to follow up with rheumatologist. Systemic therapy to control SLE will help treat/prevent cutaneous lesions  Follow up with us  as needed for skin lesions or other skin concerns.  Topical steroids (such as triamcinolone , fluocinolone, fluocinonide, mometasone, clobetasol , halobetasol, betamethasone, hydrocortisone) can cause thinning and lightening of the skin if they are used for too long in the same area. Your  physician has selected the right strength medicine for your problem and area affected on the body. Please use your medication only as directed by your physician to prevent side effects.    DISCOID LUPUS ERYTHEMATOSUS   Related Medications clobetasol  cream (TEMOVATE ) 0.05 % Apply 1 Application topically 2 (two) times daily. Until area is smooth. Avoid applying to face, groin, and axilla. Use as directed. Long-term use can cause thinning of the skin.  Return if symptoms worsen or fail to improve.  LILLETTE Lonell Drones, RMA, am acting as scribe for Boneta Sharps, MD .   Documentation: I have reviewed the above documentation for accuracy and completeness, and I agree with the above.  Boneta Sharps, MD

## 2023-01-29 ENCOUNTER — Encounter: Payer: Self-pay | Admitting: Dermatology

## 2023-02-03 ENCOUNTER — Other Ambulatory Visit: Payer: Self-pay

## 2023-02-03 MED ORDER — CLOBETASOL PROPIONATE 0.05 % EX CREA
TOPICAL_CREAM | CUTANEOUS | 0 refills | Status: AC
Start: 2023-02-03 — End: ?

## 2023-02-03 NOTE — Progress Notes (Signed)
Received fax for medication clarification.

## 2023-06-18 ENCOUNTER — Other Ambulatory Visit: Payer: Self-pay | Admitting: Physician Assistant

## 2023-06-18 DIAGNOSIS — R2242 Localized swelling, mass and lump, left lower limb: Secondary | ICD-10-CM

## 2023-06-25 ENCOUNTER — Ambulatory Visit
Admission: RE | Admit: 2023-06-25 | Discharge: 2023-06-25 | Disposition: A | Discharge status: Home / Self Care | Source: Ambulatory Visit | Attending: Physician Assistant | Admitting: Physician Assistant

## 2023-06-25 DIAGNOSIS — R2242 Localized swelling, mass and lump, left lower limb: Secondary | ICD-10-CM | POA: Diagnosis not present

## 2023-07-15 ENCOUNTER — Other Ambulatory Visit: Payer: Self-pay

## 2023-07-15 ENCOUNTER — Encounter: Payer: Self-pay | Admitting: Emergency Medicine

## 2023-07-15 ENCOUNTER — Emergency Department
Admission: EM | Admit: 2023-07-15 | Discharge: 2023-07-15 | Disposition: A | Discharge status: Home / Self Care | Payer: Worker's Compensation | Source: Ambulatory Visit | Attending: Emergency Medicine | Admitting: Emergency Medicine

## 2023-07-15 DIAGNOSIS — S0501XA Injury of conjunctiva and corneal abrasion without foreign body, right eye, initial encounter: Secondary | ICD-10-CM | POA: Insufficient documentation

## 2023-07-15 DIAGNOSIS — W448XXA Other foreign body entering into or through a natural orifice, initial encounter: Secondary | ICD-10-CM | POA: Diagnosis not present

## 2023-07-15 DIAGNOSIS — Y990 Civilian activity done for income or pay: Secondary | ICD-10-CM | POA: Diagnosis not present

## 2023-07-15 DIAGNOSIS — I10 Essential (primary) hypertension: Secondary | ICD-10-CM | POA: Insufficient documentation

## 2023-07-15 DIAGNOSIS — J45909 Unspecified asthma, uncomplicated: Secondary | ICD-10-CM | POA: Insufficient documentation

## 2023-07-15 DIAGNOSIS — H5711 Ocular pain, right eye: Secondary | ICD-10-CM

## 2023-07-15 DIAGNOSIS — S0591XA Unspecified injury of right eye and orbit, initial encounter: Secondary | ICD-10-CM | POA: Diagnosis present

## 2023-07-15 MED ORDER — MOXIFLOXACIN HCL 0.5 % OP SOLN: 1.0000 [drp] | Freq: Four times a day (QID) | OPHTHALMIC | 0 refills | Status: AC

## 2023-07-15 MED ORDER — FLUORESCEIN SODIUM 1 MG OP STRP
1.0000 | ORAL_STRIP | Freq: Once | OPHTHALMIC | Status: AC
  Administered 2023-07-15: 1 via OPHTHALMIC
  Filled 2023-07-15: qty 1

## 2023-07-15 MED ORDER — TETRACAINE HCL 0.5 % OP SOLN
2.0000 [drp] | Freq: Once | OPHTHALMIC | Status: AC
  Administered 2023-07-15: 2 [drp] via OPHTHALMIC
  Filled 2023-07-15: qty 4
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
## 2023-07-15 NOTE — Discharge Instructions (Addendum)
 Please use the antibiotic eyedrops as prescribed.  Follow-up with the ophthalmology, information is attached.  Information about corneal abrasions has been attached for you to review.   Return to the emergency department with any worsening symptoms.
